# Patient Record
Sex: Female | Born: 1960 | Race: Black or African American | Hispanic: No | Smoking: Never smoker
Health system: Southern US, Community
[De-identification: ages and names within clinical notes are randomized; demographics above are authoritative.]

## PROBLEM LIST (undated history)

## (undated) DIAGNOSIS — R7303 Prediabetes: Secondary | ICD-10-CM

## (undated) DIAGNOSIS — R0600 Dyspnea, unspecified: Secondary | ICD-10-CM

## (undated) DIAGNOSIS — R011 Cardiac murmur, unspecified: Secondary | ICD-10-CM

## (undated) DIAGNOSIS — D573 Sickle-cell trait: Secondary | ICD-10-CM

## (undated) DIAGNOSIS — R600 Localized edema: Secondary | ICD-10-CM

## (undated) DIAGNOSIS — M2550 Pain in unspecified joint: Secondary | ICD-10-CM

## (undated) DIAGNOSIS — K219 Gastro-esophageal reflux disease without esophagitis: Secondary | ICD-10-CM

## (undated) DIAGNOSIS — I10 Essential (primary) hypertension: Secondary | ICD-10-CM

## (undated) DIAGNOSIS — M1990 Unspecified osteoarthritis, unspecified site: Secondary | ICD-10-CM

## (undated) DIAGNOSIS — D649 Anemia, unspecified: Secondary | ICD-10-CM

## (undated) DIAGNOSIS — T7840XA Allergy, unspecified, initial encounter: Secondary | ICD-10-CM

## (undated) DIAGNOSIS — E785 Hyperlipidemia, unspecified: Secondary | ICD-10-CM

## (undated) HISTORY — DX: Anemia, unspecified: D64.9

## (undated) HISTORY — PX: OTHER SURGICAL HISTORY: SHX169

## (undated) HISTORY — DX: Gastro-esophageal reflux disease without esophagitis: K21.9

## (undated) HISTORY — DX: Localized edema: R60.0

## (undated) HISTORY — PX: GANGLION CYST EXCISION: SHX1691

## (undated) HISTORY — DX: Dyspnea, unspecified: R06.00

## (undated) HISTORY — DX: Prediabetes: R73.03

## (undated) HISTORY — PX: WRIST SURGERY: SHX841

## (undated) HISTORY — DX: Unspecified osteoarthritis, unspecified site: M19.90

## (undated) HISTORY — DX: Essential (primary) hypertension: I10

## (undated) HISTORY — DX: Allergy, unspecified, initial encounter: T78.40XA

## (undated) HISTORY — DX: Hyperlipidemia, unspecified: E78.5

## (undated) HISTORY — DX: Sickle-cell trait: D57.3

## (undated) HISTORY — DX: Pain in unspecified joint: M25.50

---

## 1997-10-14 ENCOUNTER — Ambulatory Visit (HOSPITAL_COMMUNITY): Admission: RE | Admit: 1997-10-14 | Discharge: 1997-10-14 | Payer: Self-pay | Admitting: Obstetrics and Gynecology

## 1998-07-08 ENCOUNTER — Other Ambulatory Visit: Admission: RE | Admit: 1998-07-08 | Discharge: 1998-07-08 | Payer: Self-pay | Admitting: Obstetrics and Gynecology

## 1999-03-02 ENCOUNTER — Emergency Department (HOSPITAL_COMMUNITY): Admission: EM | Admit: 1999-03-02 | Discharge: 1999-03-02 | Payer: Self-pay | Admitting: Internal Medicine

## 1999-07-18 ENCOUNTER — Other Ambulatory Visit: Admission: RE | Admit: 1999-07-18 | Discharge: 1999-07-18 | Payer: Self-pay | Admitting: Obstetrics and Gynecology

## 1999-08-05 ENCOUNTER — Other Ambulatory Visit: Admission: RE | Admit: 1999-08-05 | Discharge: 1999-08-05 | Payer: Self-pay | Admitting: Obstetrics and Gynecology

## 2000-01-15 ENCOUNTER — Emergency Department (HOSPITAL_COMMUNITY): Admission: EM | Admit: 2000-01-15 | Discharge: 2000-01-15 | Payer: Self-pay | Admitting: Emergency Medicine

## 2000-01-27 ENCOUNTER — Encounter: Payer: Self-pay | Admitting: Family Medicine

## 2000-01-27 ENCOUNTER — Encounter: Admission: RE | Admit: 2000-01-27 | Discharge: 2000-01-27 | Payer: Self-pay | Admitting: Family Medicine

## 2000-07-19 ENCOUNTER — Other Ambulatory Visit: Admission: RE | Admit: 2000-07-19 | Discharge: 2000-07-19 | Payer: Self-pay | Admitting: Obstetrics and Gynecology

## 2002-12-03 ENCOUNTER — Other Ambulatory Visit: Admission: RE | Admit: 2002-12-03 | Discharge: 2002-12-03 | Payer: Self-pay | Admitting: Family Medicine

## 2003-10-24 ENCOUNTER — Emergency Department (HOSPITAL_COMMUNITY): Admission: EM | Admit: 2003-10-24 | Discharge: 2003-10-25 | Payer: Self-pay | Admitting: Emergency Medicine

## 2004-01-14 ENCOUNTER — Other Ambulatory Visit: Admission: RE | Admit: 2004-01-14 | Discharge: 2004-01-14 | Payer: Self-pay | Admitting: Family Medicine

## 2004-10-20 ENCOUNTER — Other Ambulatory Visit: Admission: RE | Admit: 2004-10-20 | Discharge: 2004-10-20 | Payer: Self-pay | Admitting: Obstetrics and Gynecology

## 2004-10-20 ENCOUNTER — Ambulatory Visit (HOSPITAL_COMMUNITY): Admission: RE | Admit: 2004-10-20 | Discharge: 2004-10-20 | Payer: Self-pay | Admitting: Obstetrics and Gynecology

## 2005-01-17 ENCOUNTER — Other Ambulatory Visit: Admission: RE | Admit: 2005-01-17 | Discharge: 2005-01-17 | Payer: Self-pay | Admitting: Family Medicine

## 2006-01-17 ENCOUNTER — Other Ambulatory Visit: Admission: RE | Admit: 2006-01-17 | Discharge: 2006-01-17 | Payer: Self-pay | Admitting: Family Medicine

## 2006-09-17 ENCOUNTER — Other Ambulatory Visit: Admission: RE | Admit: 2006-09-17 | Discharge: 2006-09-17 | Payer: Self-pay | Admitting: Obstetrics and Gynecology

## 2007-02-11 ENCOUNTER — Other Ambulatory Visit: Admission: RE | Admit: 2007-02-11 | Discharge: 2007-02-11 | Payer: Self-pay | Admitting: Family Medicine

## 2007-12-09 ENCOUNTER — Other Ambulatory Visit: Admission: RE | Admit: 2007-12-09 | Discharge: 2007-12-09 | Payer: Self-pay | Admitting: *Deleted

## 2007-12-20 ENCOUNTER — Emergency Department (HOSPITAL_COMMUNITY): Admission: EM | Admit: 2007-12-20 | Discharge: 2007-12-20 | Payer: Self-pay | Admitting: Emergency Medicine

## 2008-01-02 ENCOUNTER — Ambulatory Visit (HOSPITAL_BASED_OUTPATIENT_CLINIC_OR_DEPARTMENT_OTHER): Admission: RE | Admit: 2008-01-02 | Discharge: 2008-01-02 | Payer: Self-pay | Admitting: Surgery

## 2008-01-02 ENCOUNTER — Encounter (INDEPENDENT_AMBULATORY_CARE_PROVIDER_SITE_OTHER): Payer: Self-pay | Admitting: Surgery

## 2008-03-05 ENCOUNTER — Encounter: Admission: RE | Admit: 2008-03-05 | Discharge: 2008-03-05 | Payer: Self-pay | Admitting: Family Medicine

## 2008-08-07 ENCOUNTER — Other Ambulatory Visit: Admission: RE | Admit: 2008-08-07 | Discharge: 2008-08-07 | Payer: Self-pay | Admitting: Radiology

## 2009-10-21 ENCOUNTER — Emergency Department (HOSPITAL_BASED_OUTPATIENT_CLINIC_OR_DEPARTMENT_OTHER): Admission: EM | Admit: 2009-10-21 | Discharge: 2009-10-21 | Payer: Self-pay | Admitting: Emergency Medicine

## 2010-02-16 ENCOUNTER — Ambulatory Visit: Payer: Self-pay | Admitting: Cardiovascular Disease

## 2010-02-16 ENCOUNTER — Ambulatory Visit (HOSPITAL_COMMUNITY): Admission: RE | Admit: 2010-02-16 | Discharge: 2010-02-16 | Payer: Self-pay | Admitting: Family Medicine

## 2010-02-16 ENCOUNTER — Encounter (INDEPENDENT_AMBULATORY_CARE_PROVIDER_SITE_OTHER): Payer: Self-pay | Admitting: Family Medicine

## 2010-03-21 ENCOUNTER — Ambulatory Visit (HOSPITAL_BASED_OUTPATIENT_CLINIC_OR_DEPARTMENT_OTHER): Admission: RE | Admit: 2010-03-21 | Discharge: 2010-03-21 | Payer: Self-pay | Admitting: Orthopedic Surgery

## 2010-06-14 ENCOUNTER — Inpatient Hospital Stay (INDEPENDENT_AMBULATORY_CARE_PROVIDER_SITE_OTHER)
Admission: RE | Admit: 2010-06-14 | Discharge: 2010-06-14 | Disposition: A | Discharge status: Home / Self Care | Payer: 59 | Source: Ambulatory Visit | Attending: Emergency Medicine | Admitting: Emergency Medicine

## 2010-06-14 DIAGNOSIS — H109 Unspecified conjunctivitis: Secondary | ICD-10-CM

## 2010-06-14 DIAGNOSIS — H5710 Ocular pain, unspecified eye: Secondary | ICD-10-CM

## 2010-07-12 LAB — BASIC METABOLIC PANEL
BUN: 11 mg/dL (ref 6–23)
CO2: 28 mEq/L (ref 19–32)
Calcium: 9.4 mg/dL (ref 8.4–10.5)
Chloride: 97 mEq/L (ref 96–112)
Creatinine, Ser: 0.87 mg/dL (ref 0.4–1.2)
GFR calc Af Amer: >60 mL/min (ref >60)
GFR calc non Af Amer: >60 mL/min (ref >60)
Glucose, Bld: 152 mg/dL — ABNORMAL HIGH (ref 70–99)
Potassium: 2.8 mEq/L — ABNORMAL LOW (ref 3.5–5.1)
Sodium: 134 mEq/L — ABNORMAL LOW (ref 135–145)

## 2010-07-12 LAB — POCT I-STAT, CHEM 8
BUN: 13 mg/dL (ref 6–23)
Calcium, Ion: 1.2 mmol/L (ref 1.12–1.32)
Chloride: 101 mEq/L (ref 96–112)
Creatinine, Ser: 0.9 mg/dL (ref 0.4–1.2)
Glucose, Bld: 108 mg/dL — ABNORMAL HIGH (ref 70–99)
HCT: 40 % (ref 36.0–46.0)
Hemoglobin: 13.6 g/dL (ref 12.0–15.0)
Potassium: 3.4 mEq/L — ABNORMAL LOW (ref 3.5–5.1)
Sodium: 139 mEq/L (ref 135–145)
TCO2: 29 mmol/L (ref 0–100)

## 2010-07-17 LAB — URINALYSIS, ROUTINE W REFLEX MICROSCOPIC
Bilirubin Urine: NEGATIVE
Hgb urine dipstick: NEGATIVE
Ketones, ur: NEGATIVE mg/dL
Nitrite: NEGATIVE
Protein, ur: NEGATIVE mg/dL
Specific Gravity, Urine: 1.018 (ref 1.005–1.030)
Urobilinogen, UA: 1 mg/dL (ref 0.0–1.0)

## 2010-08-29 ENCOUNTER — Other Ambulatory Visit: Payer: Self-pay | Admitting: Obstetrics and Gynecology

## 2010-09-13 NOTE — Op Note (Signed)
NAME:  [name], Leslie              ACCOUNT NO.:  [id number]   MEDICAL RECORD NO.:  [id number]          PATIENT TYPE:  AMB   LOCATION:  DSC                          FACILITY:  [facility]   PHYSICIAN:  Wilmon Arms. Corliss Skains, M.D. DATE OF BIRTH:  Aug 02, 1960   DATE OF PROCEDURE:  01/02/2008  DATE OF DISCHARGE:                               OPERATIVE REPORT   PREOPERATIVE DIAGNOSIS:  Lipoma, 3-cm, mid back.   POSTOPERATIVE DIAGNOSIS:  Lipoma, 3-cm, mid back.   PROCEDURE PERFORMED:  Excision of lipoma of the back.   SURGEON:  Wilmon Arms. Tsuei, MD   ANESTHESIA:  Local MAC.   INDICATIONS:  The patient is a 50 year old female who presents with a  palpable mass in her mid back.  This was just left to midline.  This has  been present for several years and it has become slightly larger.  This  causes discomfort when she tries to lay down or sit back in a chair.  She presents now for elective excision.   DESCRIPTION OF PROCEDURE:  The patient was brought to the operating room  and placed in a lateral position on her left side.  Her back was prepped  with Betadine and draped in sterile fashion.  She was given some  intravenous sedation.  We infiltrated the area around the mass with  0.25% Marcaine with epinephrine.  A transverse incision was made over  the mass.  Dissection was carried down to the dermis and the  subcutaneous tissues.  We dissected down to the lipoma.  This was fairly  deep and was resting on the muscle.  We excised the entire lipoma along  with some of the anterior subcutaneous tissue.  We sent this specimen  for Pathology.  The wound was then inspected for hemostasis.  It was  closed with a deep layer of 3-0 Vicryl and a subcuticular 4-0 Monocryl.  Steri-Strips and clean dressings were applied.  The patient was then  awakened and brought to recovery in stable condition.  All sponge,  instrument, and needle counts were correct.      Wilmon Arms. Tsuei, M.D.  Electronically  Signed     MKT/MEDQ  D:  01/02/2008  T:  01/02/2008  Job:  [id number]

## 2012-11-21 ENCOUNTER — Other Ambulatory Visit: Payer: Self-pay

## 2013-01-20 ENCOUNTER — Encounter: Payer: Self-pay | Admitting: Gastroenterology

## 2013-03-11 ENCOUNTER — Ambulatory Visit (AMBULATORY_SURGERY_CENTER): Payer: Self-pay

## 2013-03-11 VITALS — Ht 61.5 in | Wt 218.2 lb

## 2013-03-11 DIAGNOSIS — Z1211 Encounter for screening for malignant neoplasm of colon: Secondary | ICD-10-CM

## 2013-03-11 MED ORDER — NA SULFATE-K SULFATE-MG SULF 17.5-3.13-1.6 GM/177ML PO SOLN: ORAL | Status: DC

## 2013-03-25 ENCOUNTER — Ambulatory Visit (AMBULATORY_SURGERY_CENTER): Payer: 59 | Admitting: Gastroenterology

## 2013-03-25 ENCOUNTER — Encounter: Payer: Self-pay | Admitting: Gastroenterology

## 2013-03-25 VITALS — BP 113/70 | HR 56 | Temp 96.1°F | Resp 27 | Ht 61.5 in | Wt 218.0 lb

## 2013-03-25 DIAGNOSIS — Z1211 Encounter for screening for malignant neoplasm of colon: Secondary | ICD-10-CM

## 2013-03-25 MED ORDER — SODIUM CHLORIDE 0.9 % IV SOLN: 500.0000 mL | INTRAVENOUS | Status: DC

## 2013-03-25 NOTE — Op Note (Signed)
[facility] [address] [place], [address]   COLONOSCOPY PROCEDURE REPORT  PATIENT: Leslie [name], Leslie [name]  MR#: [id number] BIRTHDATE: March 24, 1961 , 52  yrs. old GENDER: Female ENDOSCOPIST: Louis Meckel, MD REFERRED BY: PROCEDURE DATE:  03/25/2013 PROCEDURE:   Colonoscopy, diagnostic First Screening Colonoscopy - Avg.  risk and is 50 yrs.  old or older Yes.  Prior Negative Screening - Now for repeat screening. N/A  History of Adenoma - Now for follow-up colonoscopy & has been > or = to 3 yrs.  N/A  Polyps Removed Today? No.  Recommend repeat exam, <10 yrs? No. ASA CLASS: INDICATIONS:average risk screening. MEDICATIONS: MAC sedation, administered by CRNA and propofol (Diprivan) 300mg  IV  DESCRIPTION OF PROCEDURE:   After the risks benefits and alternatives of the procedure were thoroughly explained, informed consent was obtained.  A digital rectal exam revealed no abnormalities of the rectum.   The LB [id number]  endoscope was introduced through the anus and advanced to the cecum, which was identified by both the appendix and ileocecal valve. No adverse events experienced.   The quality of the prep was excellent using Suprep  The instrument was then slowly withdrawn as the colon was fully examined.      COLON FINDINGS: A normal appearing cecum, ileocecal valve, and appendiceal orifice were identified.  The ascending, hepatic flexure, transverse, splenic flexure, descending, sigmoid colon and rectum appeared unremarkable.  No polyps or cancers were seen. Retroflexed views revealed no abnormalities. The time to cecum=3 minutes 24 seconds.  Withdrawal time=6 minutes 10 seconds.  The scope was withdrawn and the procedure completed. COMPLICATIONS: There were no complications.  ENDOSCOPIC IMPRESSION: Normal colon  RECOMMENDATIONS: Continue current colorectal screening recommendations for "routine risk" patients with a repeat colonoscopy in 10  years.   eSigned:  Louis Meckel, MD 03/25/2013 8:34 AM   cc:

## 2013-03-25 NOTE — Progress Notes (Signed)
No complaints noted in the recovery room. Maw   

## 2013-03-25 NOTE — Patient Instructions (Signed)

## 2013-03-25 NOTE — Progress Notes (Signed)
Lidocaine-40mg IV prior to Propofol InductionPropofol given over incremental dosages 

## 2013-03-26 ENCOUNTER — Telehealth: Payer: Self-pay | Admitting: *Deleted

## 2013-03-26 NOTE — Telephone Encounter (Signed)
Number identifier, left message, follow-up  

## 2014-01-07 ENCOUNTER — Encounter (HOSPITAL_COMMUNITY): Payer: Self-pay | Admitting: Emergency Medicine

## 2014-01-07 ENCOUNTER — Emergency Department (HOSPITAL_COMMUNITY)
Admission: EM | Admit: 2014-01-07 | Discharge: 2014-01-07 | Disposition: A | Discharge status: Home / Self Care | Payer: 59 | Source: Home / Self Care | Attending: Family Medicine | Admitting: Family Medicine

## 2014-01-07 DIAGNOSIS — G629 Polyneuropathy, unspecified: Secondary | ICD-10-CM

## 2014-01-07 DIAGNOSIS — G589 Mononeuropathy, unspecified: Secondary | ICD-10-CM

## 2014-01-07 NOTE — ED Provider Notes (Signed)
CSN: [id number]     Arrival date & time 01/07/14  1607 History   First MD Initiated Contact with Patient 01/07/14 1640     Chief Complaint  Patient presents with  . Numbness  . Joint Swelling   (Consider location/radiation/quality/duration/timing/severity/associated sxs/prior Treatment) HPI  Big toe numbness. L>R. Started on Friday. Significant increase in ambulation on Friday while wearing heals on Friday. Swelled a lot that day as well. Swelling improved on Saturday. Heals again on Saturday and SUnday. Swelling improves w/ rest. No chang ein numbness since Friday evening. Deneis trauma or h/o numbness. Last year told by her PCP that she was borderline DM. Denies any numbness in any other extremities, HA, SZRs, falls.    Past Medical History  Diagnosis Date  . Hypertension   . Allergy   . GERD (gastroesophageal reflux disease)    Past Surgical History  Procedure Laterality Date  . Wrist surgery    . Ganglion cyst excision      left hand  . Cyst on neck    . Cyst on back      benign   Family History  Problem Relation Age of Onset  . Breast cancer Mother    History  Substance Use Topics  . Smoking status: Never Smoker   . Smokeless tobacco: Never Used  . Alcohol Use: No     Comment: occ   OB History   Grav Para Term Preterm Abortions TAB SAB Ect Mult Living                 Review of Systems Per HPI with all other pertinent systems negative.   Allergies  Review of patient's allergies indicates no known allergies.  Home Medications   Prior to Admission medications   Medication Sig Start Date End Date Taking? Authorizing Provider  atenolol-chlorthalidone (TENORETIC) 50-25 MG per tablet Take 1 tablet by mouth daily.   Yes Historical Provider, MD  cetirizine (ZYRTEC) 10 MG tablet Take 10 mg by mouth as needed for allergies.    Historical Provider, MD  ibuprofen (ADVIL,MOTRIN) 200 MG tablet Take 200 mg by mouth every 6 (six) hours as needed.    Historical Provider, MD   Multiple Vitamin (MULTIVITAMIN) tablet Take 1 tablet by mouth daily. Gummi MVI-Take one daily    Historical Provider, MD  omeprazole (PRILOSEC) 40 MG capsule Take 40 mg by mouth as needed.    Historical Provider, MD   BP 134/82  Pulse 63  Temp(Src) 98.3 F (36.8 C) (Oral)  Resp 18  SpO2 100% Physical Exam  Constitutional: She is oriented to person, place, and time. She appears well-developed and well-nourished. No distress.  HENT:  Head: Normocephalic and atraumatic.  Eyes: EOM are normal. Pupils are equal, round, and reactive to light.  Neck: Normal range of motion. Neck supple.  Cardiovascular: Normal rate, normal heart sounds and intact distal pulses.   Pulmonary/Chest: Effort normal and breath sounds normal.  Abdominal: Soft. Bowel sounds are normal.  Musculoskeletal: Normal range of motion. She exhibits no edema and no tenderness.  Neurological: She is alert and oriented to person, place, and time. No cranial nerve deficit. She exhibits normal muscle tone. Coordination normal.  Big toes w/ decreased but intact sensation along the bottom and tips of the big toes bilat  Skin: Skin is warm. No rash noted. She is not diaphoretic.  Psychiatric: She has a normal mood and affect. Her behavior is normal. Judgment and thought content normal.      ED  Course  Procedures (including critical care time) Labs Review Labs Reviewed - No data to display  Imaging Review No results found.   MDM   1. Neuropathy   mild traumatic neuropathy.  Likely to self resolve  NSAIDs  Change footwear DM check at next PCP appt Rest F/u PCP if not resolving  Precautions given and all questions answered  Linna Darner, MD Family Medicine 01/07/2014, 5:06 PM     Waldemar Dickens, MD 01/07/14 [phone]

## 2014-01-07 NOTE — Discharge Instructions (Signed)
Your toe pain is likely what is called neuropathic pain from trauma to the nerves along the bottoms of the big toes  Please start ibuprofen 600mg  every 6 hours Please change your footwear adn rest as needed This will likely resolve over the next 2-8 weeks Consider getting rechecked for diabetes if this is not improving.   Neuropathic Pain We often think that pain has a physical cause. If we get rid of the cause, the pain should go away. Nerves themselves can also cause pain. It is called neuropathic pain, which means nerve abnormality. It may be difficult for the patients who have it and for the treating caregivers. Pain is usually described as acute (short-lived) or chronic (long-lasting). Acute pain is related to the physical sensations caused by an injury. It can last from a few seconds to many weeks, but it usually goes away when normal healing occurs. Chronic pain lasts beyond the typical healing time. With neuropathic pain, the nerve fibers themselves may be damaged or injured. They then send incorrect signals to other pain centers. The pain you feel is real, but the cause is not easy to find.  CAUSES  Chronic pain can result from diseases, such as diabetes and shingles (an infection related to chickenpox), or from trauma, surgery, or amputation. It can also happen without any known injury or disease. The nerves are sending pain messages, even though there is no identifiable cause for such messages.   Other common causes of neuropathy include diabetes, phantom limb pain, or Regional Pain Syndrome (RPS).  As with all forms of chronic back pain, if neuropathy is not correctly treated, there can be a number of associated problems that lead to a downward cycle for the patient. These include depression, sleeplessness, feelings of fear and anxiety, limited social interaction and inability to do normal daily activities or work.  The most dramatic and mysterious example of neuropathic pain is called  "phantom limb syndrome." This occurs when an arm or a leg has been removed because of illness or injury. The brain still gets pain messages from the nerves that originally carried impulses from the missing limb. These nerves now seem to misfire and cause troubling pain.  Neuropathic pain often seems to have no cause. It responds poorly to standard pain treatment. Neuropathic pain can occur after:  Shingles (herpes zoster virus infection).  A lasting burning sensation of the skin, caused usually by injury to a peripheral nerve.  Peripheral neuropathy which is widespread nerve damage, often caused by diabetes or alcoholism.  Phantom limb pain following an amputation.  Facial nerve problems (trigeminal neuralgia).  Multiple sclerosis.  Reflex sympathetic dystrophy.  Pain which comes with cancer and cancer chemotherapy.  Entrapment neuropathy such as when pressure is put on a nerve such as in carpal tunnel syndrome.  Back, leg, and hip problems (sciatica).  Spine or back surgery.  HIV Infection or AIDS where nerves are infected by viruses. Your caregiver can explain items in the above list which may apply to you. SYMPTOMS  Characteristics of neuropathic pain are:  Severe, sharp, electric shock-like, shooting, lightening-like, knife-like.  Pins and needles sensation.  Deep burning, deep cold, or deep ache.  Persistent numbness, tingling, or weakness.  Pain resulting from light touch or other stimulus that would not usually cause pain.  Increased sensitivity to something that would normally cause pain, such as a pinprick. Pain may persist for months or years following the healing of damaged tissues. When this happens, pain signals no longer  sound an alarm about current injuries or injuries about to happen. Instead, the alarm system itself is not working correctly.  Neuropathic pain may get worse instead of better over time. For some people, it can lead to serious disability. It  is important to be aware that severe injury in a limb can occur without a proper, protective pain response.Burns, cuts, and other injuries may go unnoticed. Without proper treatment, these injuries can become infected or lead to further disability. Take any injury seriously, and consult your caregiver for treatment. DIAGNOSIS  When you have a pain with no known cause, your caregiver will probably ask some specific questions:   Do you have any other conditions, such as diabetes, shingles, multiple sclerosis, or HIV infection?  How would you describe your pain? (Neuropathic pain is often described as shooting, stabbing, burning, or searing.)  Is your pain worse at any time of the day? (Neuropathic pain is usually worse at night.)  Does the pain seem to follow a certain physical pathway?  Does the pain come from an area that has missing or injured nerves? (An example would be phantom limb pain.)  Is the pain triggered by minor things such as rubbing against the sheets at night? These questions often help define the type of pain involved. Once your caregiver knows what is happening, treatment can begin. Anticonvulsant, antidepressant drugs, and various pain relievers seem to work in some cases. If another condition, such as diabetes is involved, better management of that disorder may relieve the neuropathic pain.  TREATMENT  Neuropathic pain is frequently long-lasting and tends not to respond to treatment with narcotic type pain medication. It may respond well to other drugs such as antiseizure and antidepressant medications. Usually, neuropathic problems do not completely go away, but partial improvement is often possible with proper treatment. Your caregivers have large numbers of medications available to treat you. Do not be discouraged if you do not get immediate relief. Sometimes different medications or a combination of medications will be tried before you receive the results you are hoping for.  See your caregiver if you have pain that seems to be coming from nowhere and does not go away. Help is available.  SEEK IMMEDIATE MEDICAL CARE IF:   There is a sudden change in the quality of your pain, especially if the change is on only one side of the body.  You notice changes of the skin, such as redness, black or purple discoloration, swelling, or an ulcer.  You cannot move the affected limbs. Document Released: 01/13/2004 Document Revised: 07/10/2011 Document Reviewed: 01/13/2004 [facility] Patient Information 2015 [place], [place]. This information is not intended to replace advice given to you by your health care provider. Make sure you discuss any questions you have with your health care provider.

## 2014-01-07 NOTE — ED Notes (Signed)
C/o numbness of bilateral toes onset Friday Reports it started out w/swelling Ambulated w/steady gait; NAD Alert, no signs of acute distress.

## 2014-09-15 ENCOUNTER — Other Ambulatory Visit (HOSPITAL_COMMUNITY): Payer: Self-pay | Admitting: Orthopedic Surgery

## 2014-09-15 DIAGNOSIS — M25561 Pain in right knee: Secondary | ICD-10-CM

## 2014-09-22 ENCOUNTER — Ambulatory Visit (HOSPITAL_COMMUNITY): Payer: 59

## 2014-10-01 ENCOUNTER — Ambulatory Visit (HOSPITAL_COMMUNITY): Admission: RE | Admit: 2014-10-01 | Payer: 59 | Source: Ambulatory Visit

## 2014-10-12 ENCOUNTER — Ambulatory Visit (HOSPITAL_COMMUNITY)
Admission: RE | Admit: 2014-10-12 | Discharge: 2014-10-12 | Disposition: A | Discharge status: Home / Self Care | Payer: 59 | Source: Ambulatory Visit | Attending: Orthopedic Surgery | Admitting: Orthopedic Surgery

## 2014-10-12 DIAGNOSIS — M25561 Pain in right knee: Secondary | ICD-10-CM

## 2014-10-12 DIAGNOSIS — M25461 Effusion, right knee: Secondary | ICD-10-CM | POA: Insufficient documentation

## 2014-10-12 DIAGNOSIS — M94261 Chondromalacia, right knee: Secondary | ICD-10-CM | POA: Insufficient documentation

## 2015-04-16 ENCOUNTER — Emergency Department (INDEPENDENT_AMBULATORY_CARE_PROVIDER_SITE_OTHER): Payer: 59

## 2015-04-16 ENCOUNTER — Emergency Department (HOSPITAL_COMMUNITY)
Admission: EM | Admit: 2015-04-16 | Discharge: 2015-04-16 | Disposition: A | Discharge status: Home / Self Care | Payer: 59 | Source: Home / Self Care | Attending: Family Medicine | Admitting: Family Medicine

## 2015-04-16 ENCOUNTER — Encounter (HOSPITAL_COMMUNITY): Payer: Self-pay | Admitting: *Deleted

## 2015-04-16 DIAGNOSIS — J111 Influenza due to unidentified influenza virus with other respiratory manifestations: Secondary | ICD-10-CM

## 2015-04-16 DIAGNOSIS — R69 Illness, unspecified: Principal | ICD-10-CM

## 2015-04-16 MED ORDER — IPRATROPIUM BROMIDE 0.06 % NA SOLN: 2.0000 | Freq: Four times a day (QID) | NASAL | Status: DC

## 2015-04-16 NOTE — Discharge Instructions (Signed)
Drink plenty of fluids as discussed, use medicine as prescribed, and mucinex or delsym for cough. Return or see your doctor if further problems °

## 2015-04-16 NOTE — ED Provider Notes (Signed)
CSN: [id number]     Arrival date & time 04/16/15  1359 History   First MD Initiated Contact with Patient 04/16/15 1404     Chief Complaint  Patient presents with  . URI   (Consider location/radiation/quality/duration/timing/severity/associated sxs/prior Treatment) Patient is a 54 y.o. female presenting with URI. The history is provided by the patient.  URI Presenting symptoms: congestion, cough and rhinorrhea   Presenting symptoms: no fever   Severity:  Mild Onset quality:  Gradual Duration:  1 week Progression:  Unchanged Chronicity:  New Relieved by:  None tried Worsened by:  Nothing tried Ineffective treatments:  None tried Associated symptoms: no wheezing   Risk factors: sick contacts     Past Medical History  Diagnosis Date  . Hypertension   . Allergy   . GERD (gastroesophageal reflux disease)    Past Surgical History  Procedure Laterality Date  . Wrist surgery    . Ganglion cyst excision      left hand  . Cyst on neck    . Cyst on back      benign   Family History  Problem Relation Age of Onset  . Breast cancer Mother    Social History  Substance Use Topics  . Smoking status: Never Smoker   . Smokeless tobacco: Never Used  . Alcohol Use: No     Comment: occ   OB History    No data available     Review of Systems  Constitutional: Negative for fever and chills.  HENT: Positive for congestion, postnasal drip and rhinorrhea.   Respiratory: Positive for cough. Negative for shortness of breath and wheezing.   Cardiovascular: Negative.   Genitourinary: Negative.   All other systems reviewed and are negative.   Allergies  Review of patient's allergies indicates no known allergies.  Home Medications   Prior to Admission medications   Medication Sig Start Date End Date Taking? Authorizing Provider  atenolol-chlorthalidone (TENORETIC) 50-25 MG per tablet Take 1 tablet by mouth daily.    Historical Provider, MD  cetirizine (ZYRTEC) 10 MG tablet Take  10 mg by mouth as needed for allergies.    Historical Provider, MD  ibuprofen (ADVIL,MOTRIN) 200 MG tablet Take 200 mg by mouth every 6 (six) hours as needed.    Historical Provider, MD  ipratropium (ATROVENT) 0.06 % nasal spray Place 2 sprays into both nostrils 4 (four) times daily. 04/16/15   Billy Fischer, MD  Multiple Vitamin (MULTIVITAMIN) tablet Take 1 tablet by mouth daily. Gummi MVI-Take one daily    Historical Provider, MD  omeprazole (PRILOSEC) 40 MG capsule Take 40 mg by mouth as needed.    Historical Provider, MD   Meds Ordered and Administered this Visit  Medications - No data to display  BP 155/83 mmHg  Pulse 74  Temp(Src) 98.3 F (36.8 C) (Oral)  Resp 16  SpO2 98% No data found.   Physical Exam  Constitutional: She is oriented to person, place, and time. She appears well-developed and well-nourished. No distress.  HENT:  Head: Normocephalic.  Right Ear: External ear normal.  Left Ear: External ear normal.  Mouth/Throat: Oropharynx is clear and moist.  Eyes: Conjunctivae are normal. Pupils are equal, round, and reactive to light.  Neck: Normal range of motion. Neck supple.  Cardiovascular: Regular rhythm and normal heart sounds.   Pulmonary/Chest: Effort normal and breath sounds normal.  Lymphadenopathy:    She has no cervical adenopathy.  Neurological: She is alert and oriented to person, place, and  time.  Skin: Skin is warm and dry.  Nursing note and vitals reviewed.   ED Course  Procedures (including critical care time)  Labs Review Labs Reviewed - No data to display  Imaging Review Dg Chest 2 View  04/16/2015  CLINICAL DATA:  Cold like symptoms for a week, cough, sneezing EXAM: CHEST  2 VIEW COMPARISON:  03/05/2008 FINDINGS: There is no focal parenchymal opacity. There is no pleural effusion or pneumothorax. The heart and mediastinal contours are unremarkable. The osseous structures are unremarkable. IMPRESSION: No active cardiopulmonary disease.  Electronically Signed   By: Kathreen Devoid   On: 04/16/2015 14:32   X-rays reviewed and report per radiologist.   Visual Acuity Review  Right Eye Distance:   Left Eye Distance:   Bilateral Distance:    Right Eye Near:   Left Eye Near:    Bilateral Near:         MDM   1. Influenza-like illness        Billy Fischer, MD 04/16/15 [phone]

## 2015-04-16 NOTE — ED Notes (Signed)
Pt reports  Symptoms  Of  sorethroat  As   Well       As   Congestion     X     8 days

## 2015-05-20 MED FILL — IBUPROFEN 800 MG TABLET: 800 | 30 days supply | Qty: 90 | Fill #0

## 2015-05-20 MED FILL — ATENOLOL/CHLORTHAL 50/25: 50-25 | 90 days supply | Qty: 90 | Fill #1

## 2015-08-19 MED FILL — ATENOLOL/CHLORTHAL 50/25: 50-25 | 90 days supply | Qty: 90 | Fill #2

## 2015-11-18 MED FILL — ATENOLOL/CHLORTHAL 50/25: 50-25 | 90 days supply | Qty: 90 | Fill #3

## 2015-11-23 DIAGNOSIS — N63 Unspecified lump in breast: Secondary | ICD-10-CM | POA: Diagnosis not present

## 2015-11-23 DIAGNOSIS — N6002 Solitary cyst of left breast: Secondary | ICD-10-CM | POA: Diagnosis not present

## 2015-11-23 DIAGNOSIS — N6001 Solitary cyst of right breast: Secondary | ICD-10-CM | POA: Diagnosis not present

## 2015-12-08 DIAGNOSIS — Z6841 Body Mass Index (BMI) 40.0 and over, adult: Secondary | ICD-10-CM | POA: Diagnosis not present

## 2015-12-08 DIAGNOSIS — R109 Unspecified abdominal pain: Secondary | ICD-10-CM | POA: Diagnosis not present

## 2015-12-08 DIAGNOSIS — Z113 Encounter for screening for infections with a predominantly sexual mode of transmission: Secondary | ICD-10-CM | POA: Diagnosis not present

## 2015-12-08 DIAGNOSIS — Z1151 Encounter for screening for human papillomavirus (HPV): Secondary | ICD-10-CM | POA: Diagnosis not present

## 2015-12-08 DIAGNOSIS — R102 Pelvic and perineal pain: Secondary | ICD-10-CM | POA: Diagnosis not present

## 2015-12-08 DIAGNOSIS — Z01419 Encounter for gynecological examination (general) (routine) without abnormal findings: Secondary | ICD-10-CM | POA: Diagnosis not present

## 2016-01-13 DIAGNOSIS — J309 Allergic rhinitis, unspecified: Secondary | ICD-10-CM | POA: Diagnosis not present

## 2016-01-13 DIAGNOSIS — R7303 Prediabetes: Secondary | ICD-10-CM | POA: Diagnosis not present

## 2016-01-13 DIAGNOSIS — I10 Essential (primary) hypertension: Secondary | ICD-10-CM | POA: Diagnosis not present

## 2016-01-13 DIAGNOSIS — M170 Bilateral primary osteoarthritis of knee: Secondary | ICD-10-CM | POA: Diagnosis not present

## 2016-03-02 MED FILL — ATENOLOL/CHLORTHAL 50/25: 50-25 | 90 days supply | Qty: 90 | Fill #0

## 2016-03-31 ENCOUNTER — Encounter (HOSPITAL_COMMUNITY): Payer: Self-pay | Admitting: *Deleted

## 2016-03-31 ENCOUNTER — Ambulatory Visit (INDEPENDENT_AMBULATORY_CARE_PROVIDER_SITE_OTHER): Payer: 59

## 2016-03-31 ENCOUNTER — Ambulatory Visit (HOSPITAL_COMMUNITY)
Admission: EM | Admit: 2016-03-31 | Discharge: 2016-03-31 | Disposition: A | Discharge status: Home / Self Care | Payer: 59 | Attending: Family Medicine | Admitting: Family Medicine

## 2016-03-31 DIAGNOSIS — S39012A Strain of muscle, fascia and tendon of lower back, initial encounter: Secondary | ICD-10-CM

## 2016-03-31 DIAGNOSIS — M545 Low back pain: Secondary | ICD-10-CM | POA: Diagnosis not present

## 2016-03-31 MED ORDER — DICLOFENAC POTASSIUM 50 MG PO TABS: 50.0000 mg | ORAL_TABLET | Freq: Three times a day (TID) | ORAL | 0 refills | Status: DC

## 2016-03-31 MED ORDER — CYCLOBENZAPRINE HCL 5 MG PO TABS: 5.0000 mg | ORAL_TABLET | Freq: Three times a day (TID) | ORAL | 0 refills | Status: DC

## 2016-03-31 MED ORDER — KETOROLAC TROMETHAMINE 30 MG/ML IJ SOLN
INTRAMUSCULAR | Status: AC
  Filled 2016-03-31: qty 1

## 2016-03-31 MED ORDER — KETOROLAC TROMETHAMINE 30 MG/ML IJ SOLN
30.0000 mg | Freq: Once | INTRAMUSCULAR | Status: AC
  Administered 2016-03-31: 30 mg via INTRAMUSCULAR

## 2016-03-31 MED FILL — DICLOFENAC POT 50 MG TABLET: 50 | 10 days supply | Qty: 30 | Fill #0

## 2016-03-31 MED FILL — CYCLOBENZAPRINE 5 MG TABLET: 5 | 10 days supply | Qty: 30 | Fill #0

## 2016-03-31 NOTE — ED Provider Notes (Signed)
[facility]    CSN: [id number] Arrival date & time: 03/31/16  1018     History   Chief Complaint Chief Complaint  Patient presents with  . Back Pain    HPI Leslie [name] is a 55 y.o. female.   The history is provided by the patient.  Back Pain  Location:  Lumbar spine Quality:  Stiffness Pain severity:  Moderate Onset quality:  Sudden Progression:  Worsening Chronicity:  New Context comment:  Onset getting into car after prolonged 10hr sitting at work. Relieved by:  None tried Worsened by:  Bending Risk factors: obesity     Past Medical History:  Diagnosis Date  . Allergy   . GERD (gastroesophageal reflux disease)   . Hypertension     There are no active problems to display for this patient.   Past Surgical History:  Procedure Laterality Date  . cyst on back     benign  . cyst on neck    . GANGLION CYST EXCISION     left hand  . WRIST SURGERY      OB History    No data available       Home Medications    Prior to Admission medications   Medication Sig Start Date End Date Taking? Authorizing Provider  atenolol-chlorthalidone (TENORETIC) 50-25 MG per tablet Take 1 tablet by mouth daily.    Historical Provider, MD  cetirizine (ZYRTEC) 10 MG tablet Take 10 mg by mouth as needed for allergies.    Historical Provider, MD  ibuprofen (ADVIL,MOTRIN) 200 MG tablet Take 200 mg by mouth every 6 (six) hours as needed.    Historical Provider, MD  ipratropium (ATROVENT) 0.06 % nasal spray Place 2 sprays into both nostrils 4 (four) times daily. 04/16/15   Billy Fischer, MD  Multiple Vitamin (MULTIVITAMIN) tablet Take 1 tablet by mouth daily. Gummi MVI-Take one daily    Historical Provider, MD  omeprazole (PRILOSEC) 40 MG capsule Take 40 mg by mouth as needed.    Historical Provider, MD    Family History Family History  Problem Relation Age of Onset  . Breast cancer Mother     Social History Social History  Substance Use Topics  . Smoking  status: Never Smoker  . Smokeless tobacco: Never Used  . Alcohol use No     Comment: occ     Allergies   Patient has no known allergies.   Review of Systems Review of Systems  Constitutional: Negative.   Gastrointestinal: Negative.   Genitourinary: Negative.   Musculoskeletal: Positive for back pain. Negative for gait problem and joint swelling.  Neurological: Negative.   All other systems reviewed and are negative.    Physical Exam Triage Vital Signs ED Triage Vitals  Enc Vitals Group     BP 03/31/16 1128 146/86     Pulse Rate 03/31/16 1128 60     Resp 03/31/16 1128 18     Temp 03/31/16 1128 98.6 F (37 C)     Temp Source 03/31/16 1128 Oral     SpO2 03/31/16 1128 100 %     Weight --      Height --      Head Circumference --      Peak Flow --      Pain Score 03/31/16 1127 7     Pain Loc --      Pain Edu? --      Excl. in [place]? --    No data found.  Updated Vital Signs BP 146/86 (BP Location: Right Arm)   Pulse 60   Temp 98.6 F (37 C) (Oral)   Resp 18   SpO2 100%   Visual Acuity Right Eye Distance:   Left Eye Distance:   Bilateral Distance:    Right Eye Near:   Left Eye Near:    Bilateral Near:     Physical Exam  Constitutional: She is oriented to person, place, and time. She appears well-developed and well-nourished. She appears distressed.  Abdominal: Soft. Bowel sounds are normal. There is no tenderness.  Musculoskeletal: She exhibits tenderness.       Right shoulder: She exhibits decreased range of motion, tenderness and decreased strength. She exhibits normal pulse.       Arms: Neurological: She is alert and oriented to person, place, and time.  Skin: Skin is warm and dry.  Nursing note and vitals reviewed.    UC Treatments / Results  Labs (all labs ordered are listed, but only abnormal results are displayed) Labs Reviewed - No data to display  EKG  EKG Interpretation None       Radiology No results found. X-rays reviewed and  report per radiologist.  Procedures Procedures (including critical care time)  Medications Ordered in UC Medications - No data to display   Initial Impression / Assessment and Plan / UC Course  I have reviewed the triage vital signs and the nursing notes.  Pertinent labs & imaging results that were available during my care of the patient were reviewed by me and considered in my medical decision making (see chart for details).  Clinical Course       Final Clinical Impressions(s) / UC Diagnoses   Final diagnoses:  None    New Prescriptions New Prescriptions   No medications on file     Billy Fischer, MD 03/31/16 1242

## 2016-03-31 NOTE — ED Triage Notes (Signed)
Pt  Reports     Symptoms  Of      Back     Pain       -      Pt    Denies     Any   Urinary      Symptoms      She     Ambulated  To  Room  With a  Slow  Steady  Gait       Pt  Is  Awake  And  Alert  And  Oriented

## 2016-06-01 MED FILL — ATENOLOL/CHLORTHAL 50/25: 50-25 | 90 days supply | Qty: 90 | Fill #1

## 2016-09-04 MED FILL — ATENOLOL/CHLORTHAL 50/25: 50-25 | 90 days supply | Qty: 90 | Fill #2

## 2016-11-15 MED FILL — ATENOLOL/CHLORTHAL 50/25: 50-25 | 90 days supply | Qty: 90 | Fill #3

## 2016-12-11 DIAGNOSIS — N6002 Solitary cyst of left breast: Secondary | ICD-10-CM | POA: Diagnosis not present

## 2016-12-11 DIAGNOSIS — Z803 Family history of malignant neoplasm of breast: Secondary | ICD-10-CM | POA: Diagnosis not present

## 2016-12-11 DIAGNOSIS — N6322 Unspecified lump in the left breast, upper inner quadrant: Secondary | ICD-10-CM | POA: Diagnosis not present

## 2016-12-11 DIAGNOSIS — Z01419 Encounter for gynecological examination (general) (routine) without abnormal findings: Secondary | ICD-10-CM | POA: Diagnosis not present

## 2016-12-11 DIAGNOSIS — N6001 Solitary cyst of right breast: Secondary | ICD-10-CM | POA: Diagnosis not present

## 2016-12-11 DIAGNOSIS — Z1151 Encounter for screening for human papillomavirus (HPV): Secondary | ICD-10-CM | POA: Diagnosis not present

## 2016-12-11 DIAGNOSIS — N6012 Diffuse cystic mastopathy of left breast: Secondary | ICD-10-CM | POA: Diagnosis not present

## 2016-12-11 DIAGNOSIS — Z6841 Body Mass Index (BMI) 40.0 and over, adult: Secondary | ICD-10-CM | POA: Diagnosis not present

## 2016-12-11 DIAGNOSIS — N6312 Unspecified lump in the right breast, upper inner quadrant: Secondary | ICD-10-CM | POA: Diagnosis not present

## 2017-01-17 DIAGNOSIS — J309 Allergic rhinitis, unspecified: Secondary | ICD-10-CM | POA: Diagnosis not present

## 2017-01-17 DIAGNOSIS — J069 Acute upper respiratory infection, unspecified: Secondary | ICD-10-CM | POA: Diagnosis not present

## 2017-01-17 DIAGNOSIS — R7303 Prediabetes: Secondary | ICD-10-CM | POA: Diagnosis not present

## 2017-01-17 DIAGNOSIS — K219 Gastro-esophageal reflux disease without esophagitis: Secondary | ICD-10-CM | POA: Diagnosis not present

## 2017-01-17 DIAGNOSIS — M170 Bilateral primary osteoarthritis of knee: Secondary | ICD-10-CM | POA: Diagnosis not present

## 2017-01-17 DIAGNOSIS — I10 Essential (primary) hypertension: Secondary | ICD-10-CM | POA: Diagnosis not present

## 2017-03-02 MED FILL — ATENOLOL/CHLORTHAL 50/25: 50-25 | 90 days supply | Qty: 90 | Fill #0

## 2017-03-14 DIAGNOSIS — H538 Other visual disturbances: Secondary | ICD-10-CM | POA: Diagnosis not present

## 2017-03-14 DIAGNOSIS — H524 Presbyopia: Secondary | ICD-10-CM | POA: Diagnosis not present

## 2017-03-14 DIAGNOSIS — H53143 Visual discomfort, bilateral: Secondary | ICD-10-CM | POA: Diagnosis not present

## 2017-04-19 DIAGNOSIS — E7800 Pure hypercholesterolemia, unspecified: Secondary | ICD-10-CM | POA: Diagnosis not present

## 2017-04-19 DIAGNOSIS — E871 Hypo-osmolality and hyponatremia: Secondary | ICD-10-CM | POA: Diagnosis not present

## 2017-04-20 MED FILL — ATORVASTATIN 10 MG TABLET: 10 | 30 days supply | Qty: 30 | Fill #0

## 2017-05-29 MED FILL — ATENOLOL/CHLORTHAL 50/25: 50-25 | 90 days supply | Qty: 90 | Fill #1

## 2017-05-29 MED FILL — ATORVASTATIN 10 MG TABLET: 10 | 90 days supply | Qty: 90 | Fill #1

## 2017-06-11 DIAGNOSIS — E7800 Pure hypercholesterolemia, unspecified: Secondary | ICD-10-CM | POA: Diagnosis not present

## 2017-06-11 DIAGNOSIS — Z79899 Other long term (current) drug therapy: Secondary | ICD-10-CM | POA: Diagnosis not present

## 2017-08-28 DIAGNOSIS — M722 Plantar fascial fibromatosis: Secondary | ICD-10-CM | POA: Diagnosis not present

## 2017-08-28 MED FILL — MELOXICAM 15 MG TABLET: 15 | 30 days supply | Qty: 30 | Fill #0

## 2017-08-29 MED FILL — ATORVASTATIN 10 MG TABLET: 10 | 90 days supply | Qty: 90 | Fill #2

## 2017-08-29 MED FILL — ATENOLOL/CHLORTHAL 50/25: 50-25 | 90 days supply | Qty: 90 | Fill #2

## 2017-11-09 ENCOUNTER — Telehealth: Payer: Self-pay | Admitting: Family

## 2017-11-09 DIAGNOSIS — G43901 Migraine, unspecified, not intractable, with status migrainosus: Secondary | ICD-10-CM

## 2017-11-09 MED ORDER — IBUPROFEN 800 MG PO TABS: 800.0000 mg | ORAL_TABLET | Freq: Three times a day (TID) | ORAL | 0 refills | Status: DC | PRN

## 2017-11-09 MED FILL — IBUPROFEN 800 MG TAB: 800 | 30 days supply | Qty: 90 | Fill #0

## 2017-11-09 NOTE — Telephone Encounter (Signed)
Leslie [name] needing 800 mg ibuprofen prn for increased migraine pain, # 90 with no refills.  RX for above e-scribed and sent to pharmacy on record  [place], [place] - [address] [place] [address] Phone: [phone] Fax: [phone]

## 2017-12-06 MED FILL — ATENOLOL/CHLORTHAL 50/25: 50-25 | 90 days supply | Qty: 90 | Fill #3

## 2017-12-06 MED FILL — ATORVASTATIN 10 MG TABLET: 10 | 90 days supply | Qty: 90 | Fill #0

## 2017-12-11 DIAGNOSIS — N911 Secondary amenorrhea: Secondary | ICD-10-CM | POA: Diagnosis not present

## 2017-12-11 DIAGNOSIS — D509 Iron deficiency anemia, unspecified: Secondary | ICD-10-CM | POA: Diagnosis not present

## 2017-12-11 DIAGNOSIS — Z1231 Encounter for screening mammogram for malignant neoplasm of breast: Secondary | ICD-10-CM | POA: Diagnosis not present

## 2017-12-11 DIAGNOSIS — Z01419 Encounter for gynecological examination (general) (routine) without abnormal findings: Secondary | ICD-10-CM | POA: Diagnosis not present

## 2017-12-11 DIAGNOSIS — Z1151 Encounter for screening for human papillomavirus (HPV): Secondary | ICD-10-CM | POA: Diagnosis not present

## 2018-01-03 DIAGNOSIS — Z3202 Encounter for pregnancy test, result negative: Secondary | ICD-10-CM | POA: Diagnosis not present

## 2018-01-03 DIAGNOSIS — T8332XA Displacement of intrauterine contraceptive device, initial encounter: Secondary | ICD-10-CM | POA: Diagnosis not present

## 2018-01-03 DIAGNOSIS — Z30432 Encounter for removal of intrauterine contraceptive device: Secondary | ICD-10-CM | POA: Diagnosis not present

## 2018-01-14 DIAGNOSIS — S8254XA Nondisplaced fracture of medial malleolus of right tibia, initial encounter for closed fracture: Secondary | ICD-10-CM | POA: Diagnosis not present

## 2018-01-16 DIAGNOSIS — M170 Bilateral primary osteoarthritis of knee: Secondary | ICD-10-CM | POA: Diagnosis not present

## 2018-01-16 DIAGNOSIS — R7303 Prediabetes: Secondary | ICD-10-CM | POA: Diagnosis not present

## 2018-01-16 DIAGNOSIS — J309 Allergic rhinitis, unspecified: Secondary | ICD-10-CM | POA: Diagnosis not present

## 2018-01-16 DIAGNOSIS — I10 Essential (primary) hypertension: Secondary | ICD-10-CM | POA: Diagnosis not present

## 2018-01-16 DIAGNOSIS — E7800 Pure hypercholesterolemia, unspecified: Secondary | ICD-10-CM | POA: Diagnosis not present

## 2018-01-16 DIAGNOSIS — K219 Gastro-esophageal reflux disease without esophagitis: Secondary | ICD-10-CM | POA: Diagnosis not present

## 2018-01-22 MED FILL — DEXILANT DR 60 MG CAPSULE: 60 | 90 days supply | Qty: 90 | Fill #0

## 2018-01-28 DIAGNOSIS — S8254XD Nondisplaced fracture of medial malleolus of right tibia, subsequent encounter for closed fracture with routine healing: Secondary | ICD-10-CM | POA: Diagnosis not present

## 2018-02-14 DIAGNOSIS — R748 Abnormal levels of other serum enzymes: Secondary | ICD-10-CM | POA: Diagnosis not present

## 2018-02-18 ENCOUNTER — Other Ambulatory Visit: Payer: Self-pay | Admitting: Family Medicine

## 2018-02-18 DIAGNOSIS — R748 Abnormal levels of other serum enzymes: Secondary | ICD-10-CM

## 2018-02-18 MED FILL — POTASSIUM CL ER 20 MEQ TABL: 20 | 90 days supply | Qty: 90 | Fill #0

## 2018-02-18 MED FILL — VITAMIN D-3 2,000 UNIT TAB: 50 MCG | 90 days supply | Qty: 90 | Fill #0

## 2018-02-20 MED FILL — DEXILANT DR 60 MG CAPSULE: 60 | 90 days supply | Qty: 90 | Fill #0

## 2018-02-22 ENCOUNTER — Ambulatory Visit
Admission: RE | Admit: 2018-02-22 | Discharge: 2018-02-22 | Disposition: A | Discharge status: Home / Self Care | Payer: 59 | Source: Ambulatory Visit | Attending: Family Medicine | Admitting: Family Medicine

## 2018-02-22 DIAGNOSIS — R748 Abnormal levels of other serum enzymes: Secondary | ICD-10-CM

## 2018-02-22 DIAGNOSIS — K7689 Other specified diseases of liver: Secondary | ICD-10-CM | POA: Diagnosis not present

## 2018-02-25 ENCOUNTER — Other Ambulatory Visit: Payer: Self-pay | Admitting: Family Medicine

## 2018-02-25 DIAGNOSIS — R9389 Abnormal findings on diagnostic imaging of other specified body structures: Secondary | ICD-10-CM

## 2018-02-28 DIAGNOSIS — S8254XD Nondisplaced fracture of medial malleolus of right tibia, subsequent encounter for closed fracture with routine healing: Secondary | ICD-10-CM | POA: Diagnosis not present

## 2018-03-10 ENCOUNTER — Ambulatory Visit
Admission: RE | Admit: 2018-03-10 | Discharge: 2018-03-10 | Disposition: A | Discharge status: Home / Self Care | Payer: 59 | Source: Ambulatory Visit | Attending: Family Medicine | Admitting: Family Medicine

## 2018-03-10 DIAGNOSIS — K760 Fatty (change of) liver, not elsewhere classified: Secondary | ICD-10-CM | POA: Diagnosis not present

## 2018-03-10 DIAGNOSIS — R9389 Abnormal findings on diagnostic imaging of other specified body structures: Secondary | ICD-10-CM

## 2018-03-10 MED ORDER — GADOBENATE DIMEGLUMINE 529 MG/ML IV SOLN
20.0000 mL | Freq: Once | INTRAVENOUS | Status: AC | PRN
  Administered 2018-03-10: 20 mL via INTRAVENOUS

## 2018-03-12 MED FILL — ATORVASTATIN 10 MG TABLET: 10 | 90 days supply | Qty: 90 | Fill #0

## 2018-03-12 MED FILL — ATENOLOL/CHLORTHAL 50/25: 50-25 | 90 days supply | Qty: 90 | Fill #0

## 2018-03-18 DIAGNOSIS — E876 Hypokalemia: Secondary | ICD-10-CM | POA: Diagnosis not present

## 2018-03-19 MED FILL — AMLODIPINE BESYLATE 5 MG TA: 5 | 30 days supply | Qty: 30 | Fill #0

## 2018-04-05 ENCOUNTER — Other Ambulatory Visit: Payer: Self-pay | Admitting: Family Medicine

## 2018-04-05 ENCOUNTER — Ambulatory Visit
Admission: RE | Admit: 2018-04-05 | Discharge: 2018-04-05 | Disposition: A | Discharge status: Home / Self Care | Payer: 59 | Source: Ambulatory Visit | Attending: Family Medicine | Admitting: Family Medicine

## 2018-04-05 DIAGNOSIS — I10 Essential (primary) hypertension: Secondary | ICD-10-CM | POA: Diagnosis not present

## 2018-04-05 DIAGNOSIS — E876 Hypokalemia: Secondary | ICD-10-CM | POA: Diagnosis not present

## 2018-04-05 DIAGNOSIS — R609 Edema, unspecified: Secondary | ICD-10-CM | POA: Diagnosis not present

## 2018-04-05 DIAGNOSIS — R0602 Shortness of breath: Secondary | ICD-10-CM | POA: Diagnosis not present

## 2018-04-05 DIAGNOSIS — R0600 Dyspnea, unspecified: Secondary | ICD-10-CM

## 2018-04-05 MED FILL — NYSTATIN-TRIAMCINOLONE CRM: 100000-0.1 | 10 days supply | Qty: 30 | Fill #0

## 2018-04-09 MED FILL — SPIRONOLACTONE 25 MG TABS: 25 | 30 days supply | Qty: 30 | Fill #0

## 2018-05-08 DIAGNOSIS — K219 Gastro-esophageal reflux disease without esophagitis: Secondary | ICD-10-CM | POA: Diagnosis not present

## 2018-05-08 DIAGNOSIS — R05 Cough: Secondary | ICD-10-CM | POA: Diagnosis not present

## 2018-05-08 DIAGNOSIS — I10 Essential (primary) hypertension: Secondary | ICD-10-CM | POA: Diagnosis not present

## 2018-05-20 MED FILL — SPIRONOLACTONE 25 MG TABS: 25 | 90 days supply | Qty: 90 | Fill #0

## 2018-06-24 MED FILL — ATORVASTATIN 10 MG TABLET: 10 | 90 days supply | Qty: 90 | Fill #1

## 2018-07-10 MED FILL — VITAMIN D-3 2,000 UNIT TAB: 50 MCG | 90 days supply | Qty: 90 | Fill #1

## 2018-08-01 MED FILL — MOMETASONE FUROATE 50 MCG S: 50 | 30 days supply | Qty: 17 | Fill #0

## 2018-08-01 MED FILL — DEXILANT DR 60 MG CAPSULE: 60 | 90 days supply | Qty: 90 | Fill #1

## 2018-08-01 MED FILL — SPIRONOLACTONE 25 MG TABS: 25 | 90 days supply | Qty: 90 | Fill #1

## 2018-09-19 MED FILL — ATORVASTATIN 10 MG TABLET: 10 | 90 days supply | Qty: 90 | Fill #2

## 2018-10-02 MED FILL — VITAMIN D-3 2,000 UNIT TAB: 50 MCG | 90 days supply | Qty: 90 | Fill #2

## 2018-11-28 MED FILL — SPIRONOLACTONE 25 MG TABS: 25 | 90 days supply | Qty: 90 | Fill #2

## 2018-12-15 MED FILL — ATORVASTATIN 10 MG TABLET: 10 | 90 days supply | Qty: 90 | Fill #3

## 2018-12-16 DIAGNOSIS — Z6841 Body Mass Index (BMI) 40.0 and over, adult: Secondary | ICD-10-CM | POA: Diagnosis not present

## 2018-12-16 DIAGNOSIS — Z01419 Encounter for gynecological examination (general) (routine) without abnormal findings: Secondary | ICD-10-CM | POA: Diagnosis not present

## 2018-12-16 DIAGNOSIS — Z1151 Encounter for screening for human papillomavirus (HPV): Secondary | ICD-10-CM | POA: Diagnosis not present

## 2018-12-16 DIAGNOSIS — Z1231 Encounter for screening mammogram for malignant neoplasm of breast: Secondary | ICD-10-CM | POA: Diagnosis not present

## 2019-01-17 DIAGNOSIS — M170 Bilateral primary osteoarthritis of knee: Secondary | ICD-10-CM | POA: Diagnosis not present

## 2019-01-17 DIAGNOSIS — I10 Essential (primary) hypertension: Secondary | ICD-10-CM | POA: Diagnosis not present

## 2019-01-17 DIAGNOSIS — Z23 Encounter for immunization: Secondary | ICD-10-CM | POA: Diagnosis not present

## 2019-01-17 DIAGNOSIS — R7303 Prediabetes: Secondary | ICD-10-CM | POA: Diagnosis not present

## 2019-01-17 DIAGNOSIS — J309 Allergic rhinitis, unspecified: Secondary | ICD-10-CM | POA: Diagnosis not present

## 2019-01-17 DIAGNOSIS — K219 Gastro-esophageal reflux disease without esophagitis: Secondary | ICD-10-CM | POA: Diagnosis not present

## 2019-01-17 DIAGNOSIS — Z6841 Body Mass Index (BMI) 40.0 and over, adult: Secondary | ICD-10-CM | POA: Diagnosis not present

## 2019-01-17 DIAGNOSIS — E7800 Pure hypercholesterolemia, unspecified: Secondary | ICD-10-CM | POA: Diagnosis not present

## 2019-01-17 MED FILL — MONTELUKAST SOD 10 MG TAB: 10 | 90 days supply | Qty: 90 | Fill #0

## 2019-01-17 MED FILL — ATENOLOL 50 MG TABLET: 50 | 30 days supply | Qty: 30 | Fill #0

## 2019-01-20 MED FILL — VITAMIN D-3 2,000 UNIT TAB: 50 MCG | 90 days supply | Qty: 90 | Fill #3

## 2019-02-04 ENCOUNTER — Other Ambulatory Visit: Payer: Self-pay

## 2019-02-04 ENCOUNTER — Encounter: Payer: Self-pay | Admitting: Family Medicine

## 2019-02-04 ENCOUNTER — Ambulatory Visit (INDEPENDENT_AMBULATORY_CARE_PROVIDER_SITE_OTHER): Payer: 59 | Admitting: Family Medicine

## 2019-02-04 ENCOUNTER — Encounter (INDEPENDENT_AMBULATORY_CARE_PROVIDER_SITE_OTHER): Payer: Self-pay | Admitting: Family Medicine

## 2019-02-04 VITALS — BP 129/82 | HR 53 | Temp 98.1°F | Ht 62.0 in | Wt 243.0 lb

## 2019-02-04 DIAGNOSIS — R739 Hyperglycemia, unspecified: Secondary | ICD-10-CM | POA: Diagnosis not present

## 2019-02-04 DIAGNOSIS — E7849 Other hyperlipidemia: Secondary | ICD-10-CM

## 2019-02-04 DIAGNOSIS — E66813 Obesity, class 3: Secondary | ICD-10-CM

## 2019-02-04 DIAGNOSIS — Z9189 Other specified personal risk factors, not elsewhere classified: Secondary | ICD-10-CM

## 2019-02-04 DIAGNOSIS — I10 Essential (primary) hypertension: Secondary | ICD-10-CM | POA: Diagnosis not present

## 2019-02-04 DIAGNOSIS — Z0289 Encounter for other administrative examinations: Secondary | ICD-10-CM

## 2019-02-04 DIAGNOSIS — E559 Vitamin D deficiency, unspecified: Secondary | ICD-10-CM

## 2019-02-04 DIAGNOSIS — R0602 Shortness of breath: Secondary | ICD-10-CM

## 2019-02-04 DIAGNOSIS — Z1331 Encounter for screening for depression: Secondary | ICD-10-CM | POA: Diagnosis not present

## 2019-02-04 DIAGNOSIS — R5383 Other fatigue: Secondary | ICD-10-CM

## 2019-02-04 DIAGNOSIS — Z6841 Body Mass Index (BMI) 40.0 and over, adult: Secondary | ICD-10-CM

## 2019-02-05 LAB — CBC WITH DIFFERENTIAL/PLATELET
Basophils Absolute: 0 10*3/uL (ref 0.0–0.2)
Basos: 0 %
EOS (ABSOLUTE): 0.2 10*3/uL (ref 0.0–0.4)
Eos: 2 %
Hematocrit: 36.9 % (ref 34.0–46.6)
Hemoglobin: 11.6 g/dL (ref 11.1–15.9)
Immature Grans (Abs): 0 10*3/uL (ref 0.0–0.1)
Immature Granulocytes: 0 %
Lymphocytes Absolute: 3.3 10*3/uL — ABNORMAL HIGH (ref 0.7–3.1)
Lymphs: 34 %
MCH: 25.1 pg — ABNORMAL LOW (ref 26.6–33.0)
MCHC: 31.4 g/dL — ABNORMAL LOW (ref 31.5–35.7)
MCV: 80 fL (ref 79–97)
Monocytes Absolute: 0.6 10*3/uL (ref 0.1–0.9)
Monocytes: 6 %
Neutrophils Absolute: 5.5 10*3/uL (ref 1.4–7.0)
Neutrophils: 58 %
Platelets: 315 10*3/uL (ref 150–450)
RBC: 4.63 x10E6/uL (ref 3.77–5.28)
RDW: 14.3 % (ref 11.7–15.4)
WBC: 9.7 10*3/uL (ref 3.4–10.8)

## 2019-02-05 LAB — VITAMIN D 25 HYDROXY (VIT D DEFICIENCY, FRACTURES): Vit D, 25-Hydroxy: 39.4 ng/mL (ref 30.0–100.0)

## 2019-02-05 LAB — COMPREHENSIVE METABOLIC PANEL
ALT: 43 IU/L — ABNORMAL HIGH (ref 0–32)
AST: 28 IU/L (ref 0–40)
Albumin/Globulin Ratio: 1.2 (ref 1.2–2.2)
Albumin: 4.2 g/dL (ref 3.8–4.9)
Alkaline Phosphatase: 198 IU/L — ABNORMAL HIGH (ref 39–117)
BUN/Creatinine Ratio: 19 (ref 9–23)
BUN: 18 mg/dL (ref 6–24)
Bilirubin Total: 0.5 mg/dL (ref 0.0–1.2)
CO2: 23 mmol/L (ref 20–29)
Calcium: 10.1 mg/dL (ref 8.7–10.2)
Chloride: 102 mmol/L (ref 96–106)
Creatinine, Ser: 0.94 mg/dL (ref 0.57–1.00)
GFR calc Af Amer: 78 mL/min/{1.73_m2} (ref >59)
GFR calc non Af Amer: 68 mL/min/{1.73_m2} (ref >59)
Globulin, Total: 3.5 g/dL (ref 1.5–4.5)
Glucose: 128 mg/dL — ABNORMAL HIGH (ref 65–99)
Potassium: 4.3 mmol/L (ref 3.5–5.2)
Sodium: 138 mmol/L (ref 134–144)
Total Protein: 7.7 g/dL (ref 6.0–8.5)

## 2019-02-05 LAB — T3: T3, Total: 98 ng/dL (ref 71–180)

## 2019-02-05 LAB — HEMOGLOBIN A1C
Est. average glucose Bld gHb Est-mCnc: 131 mg/dL
Hgb A1c MFr Bld: 6.2 % — ABNORMAL HIGH (ref 4.8–5.6)

## 2019-02-05 LAB — TSH: TSH: 1.24 u[IU]/mL (ref 0.450–4.500)

## 2019-02-05 LAB — T4, FREE: Free T4: 1.22 ng/dL (ref 0.82–1.77)

## 2019-02-05 LAB — LIPID PANEL WITH LDL/HDL RATIO
Cholesterol, Total: 176 mg/dL (ref 100–199)
HDL: 56 mg/dL (ref >39)
LDL Chol Calc (NIH): 106 mg/dL — ABNORMAL HIGH (ref 0–99)
LDL/HDL Ratio: 1.9 ratio (ref 0.0–3.2)
Triglycerides: 73 mg/dL (ref 0–149)
VLDL Cholesterol Cal: 14 mg/dL (ref 5–40)

## 2019-02-05 LAB — FOLATE: Folate: 5.9 ng/mL (ref >3.0)

## 2019-02-05 LAB — VITAMIN B12: Vitamin B-12: 763 pg/mL (ref 232–1245)

## 2019-02-05 LAB — INSULIN, RANDOM: INSULIN: 14.9 u[IU]/mL (ref 2.6–24.9)

## 2019-02-06 NOTE — Progress Notes (Signed)
Office: [phone]  /  Fax: [phone]   Dear Dr. Marisue [name],   Thank you for referring Leslie [name] to our clinic. The following note includes my evaluation and treatment recommendations.  HPI:   Chief Complaint: OBESITY    Leslie [name] has been referred by Leslie Arabian, MD for consultation regarding her obesity and obesity related comorbidities.    Leslie [name] (MR# [id number]) is a 58 y.o. female who presents on 02/04/2019 for obesity evaluation and treatment. Current BMI is Body mass index is 44.45 kg/m. Leslie [name] has been struggling with her weight for many years and has been unsuccessful in either losing weight, maintaining weight loss, or reaching her healthy weight goal.     Leslie [name] is from [place] and she eats large amounts of rice daily, which she thinks is affecting her weight.     Leslie [name] attended our information session and states she is currently in the action stage of change and ready to dedicate time achieving and maintaining a healthier weight. Leslie [name] is interested in becoming our patient and working on intensive lifestyle modifications including (but not limited to) diet, exercise and weight loss.    Leslie [name] states her family eats meals together her desired weight loss is 63 lbs she started gaining weight after her divorce her heaviest weight ever was 244 lbs she is a picky eater and doesn't like to eat healthier foods  she has significant food cravings issues  she snacks frequently in the evenings she skips meals frequently she is frequently drinking liquids with calories she frequently makes poor food choices she struggles with emotional eating    Fatigue Leslie [name] feels her energy is lower than it should be. This has worsened with weight gain and has not worsened recently. Leslie [name] admits to daytime somnolence and  admits to waking up still tired. Patient is at risk for obstructive sleep apnea. Patent has a history of symptoms of daytime fatigue and  morning headache. Patient generally gets 4 hours of sleep per night, and states they generally have nightime awakenings. Snoring is present. Apneic episodes are present. Epworth Sleepiness Score is 15.  Dyspnea on exertion Leslie [name] notes increasing shortness of breath with exercising and seems to be worsening over time with weight gain. She notes getting out of breath sooner with activity than she used to. This has not gotten worse recently. Leslie [name] denies orthopnea.  Hyperlipidemia Leslie [name] has hyperlipidemia and she is on Lipitor. She doesn't have any recent labs in [place]. She would like to improve her cholesterol levels with intensive lifestyle modification including a low saturated fat diet, exercise and weight loss. She denies any chest pain, claudication or myalgias.  Vitamin D Deficiency Leslie [name] has a diagnosis of vitamin D deficiency. She is on Vit D and doesn't have any recent labs. She notes fatigue and denies nausea, vomiting or muscle weakness.  Hypertension Leslie [name] is a 58 y.o. female with hypertension. Leslie [name]'s blood pressure is stable on medications. She denies chest pain, headaches, or dizziness. She is working on weight loss to help control her blood pressure with the goal of decreasing her risk of heart attack and stroke.   Hyperglycemia Leslie [name] has a history of some elevated blood glucose readings, and has been told she has pre-diabetes. She doesn't have any recent labs. She notes polyphagia.   At risk for diabetes Leslie [name] is at higher than average risk for developing diabetes due to her obesity and hyperglycemia. She currently denies polyuria or polydipsia.  Leslie [name]  and Mood (modified PHQ-9) score was  Depression screen PHQ 2/9 02/04/2019  Decreased Interest 3  Down, Depressed, Hopeless 3  PHQ - 2 Score 6  Altered sleeping 3  Tired, decreased energy 3  Change in appetite 3  Feeling bad or failure about yourself  3  Trouble concentrating  2  Moving slowly or fidgety/restless 1  Suicidal thoughts 0  PHQ-9 Score 21  Difficult doing work/chores Not difficult at all    ASSESSMENT AND PLAN:  Other fatigue - Plan: EKG 12-Lead, Vitamin B12, CBC with Differential/Platelet, T3, T4, free, TSH, Folate  Shortness of breath on exertion  Other hyperlipidemia - Plan: Lipid Panel With LDL/HDL Ratio  Vitamin D deficiency - Plan: Vitamin B12, VITAMIN D 25 Hydroxy (Vit-D Deficiency, Fractures)  Essential hypertension  Hyperglycemia - Plan: Comprehensive metabolic panel, Hemoglobin A1c, Insulin, random  Depression screening  At risk for diabetes mellitus  Class 3 severe obesity with serious comorbidity and body mass index (BMI) of 40.0 to 44.9 in adult, unspecified obesity type (HCC)  PLAN:  Fatigue Leslie [name] was informed that her fatigue may be related to obesity, depression or many other causes. Labs will be ordered, and in the meanwhile Leslie [name] has agreed to work on diet, exercise and weight loss to help with fatigue. Proper sleep hygiene was discussed including the need for 7-8 hours of quality sleep each night. A sleep study was not ordered based on symptoms and Epworth score.  Dyspnea on exertion Leslie [name]'s shortness of breath appears to be obesity related and exercise induced. She has agreed to work on weight loss and gradually increase exercise to treat her exercise induced shortness of breath. If Leslie [name] follows our instructions and loses weight without improvement of her shortness of breath, we will plan to refer to pulmonology. We will monitor this condition regularly. Leslie [name] agrees to this plan.  Hyperlipidemia Leslie [name] was informed of the American Heart Association Guidelines emphasizing intensive lifestyle modifications as the first line treatment for hyperlipidemia. We discussed many lifestyle modifications today in depth, and Leslie [name] will continue to work on decreasing saturated fats such as fatty red meat, butter and  many fried foods. She will also increase vegetables and lean protein in her diet and continue to work on exercise and weight loss efforts. Malisha will start her diet and we will check labs today.  Vitamin D Deficiency Iraida was informed that low vitamin D levels contributes to fatigue and are associated with obesity, breast, and colon cancer. Sharanya agrees to continue taking Vit D and will follow up for routine testing of vitamin D, at least 2-3 times per year. She was informed of the risk of over-replacement of vitamin D and agrees to not increase her dose unless she discusses this with [place] first. We will check labs today. Melodey agrees to follow up with our clinic in 2 weeks.  Hypertension We discussed sodium restriction, working on healthy weight loss, and a regular exercise program as the means to achieve improved blood pressure control. Leslie [name] agreed with this plan and agreed to follow up as directed. We will continue to monitor her blood pressure as well as her progress with the above lifestyle modifications. She will continue her medications as prescribed and will watch for signs of hypotension as she continues her lifestyle modifications. Rosielee will start her diet prescription and we will check labs today. Tasnim agrees to follow up with our clinic in 2 weeks.  Hyperglycemia Fasting labs will be obtained today and results with be discussed with  Leslie [name] in 2 weeks at her follow up visit. In the meanwhile Leslie [name] was started on a lower simple carbohydrate diet prescription and will work on weight loss efforts.  Diabetes risk counseling Leslie [name] was given extended (15 minutes) diabetes prevention counseling today. She is 58 y.o. female and has risk factors for diabetes including obesity and hyperglycemia. We discussed intensive lifestyle modifications today with an emphasis on weight loss as well as increasing exercise and decreasing simple carbohydrates in her diet.  Depression Screen  Leslie [name] had a strongly positive depression screening. Depression is commonly associated with obesity and often results in emotional eating behaviors. We will monitor this closely and work on CBT to help improve the non-hunger eating patterns. Referral to Psychology may be required if no improvement is seen as she continues in our clinic.  Obesity Leslie [name] is currently in the action stage of change and her goal is to continue with weight loss efforts. I recommend Leslie [name] begin the structured treatment plan as follows:  She has agreed to follow the Category 2 plan + 100 calories Leslie [name] has been instructed to eventually work up to a goal of 150 minutes of combined cardio and strengthening exercise per week for weight loss and overall health benefits. We discussed the following Behavioral Modification Strategies today: increasing lean protein intake, decreasing simple carbohydrates, increasing vegetables, and work on meal planning and easy cooking plans   She was informed of the importance of frequent follow up visits to maximize her success with intensive lifestyle modifications for her multiple health conditions. She was informed we would discuss her lab results at her next visit unless there is a critical issue that needs to be addressed sooner. Leslie [name] agreed to keep her next visit at the agreed upon time to discuss these results.  ALLERGIES: No Known Allergies  MEDICATIONS: Current Outpatient Medications on File Prior to Visit  Medication Sig Dispense Refill  . atenolol-chlorthalidone (TENORETIC) 50-25 MG per tablet Take 1 tablet by mouth daily.    [name] atorvastatin (LIPITOR) 10 MG tablet Take 10 mg by mouth daily.    . cetirizine (ZYRTEC) 10 MG tablet Take 10 mg by mouth as needed for allergies.    . Cholecalciferol (VITAMIN D) 50 MCG (2000 UT) CAPS Take 1 capsule by mouth daily.    [name] dexlansoprazole (DEXILANT) 60 MG capsule Take 60 mg by mouth daily.    . naproxen sodium (ALEVE) 220 MG tablet  Take 220 mg by mouth daily as needed.    [name] spironolactone (ALDACTONE) 50 MG tablet Take 50 mg by mouth daily.     No current facility-administered medications on file prior to visit.     PAST MEDICAL HISTORY: Past Medical History:  Diagnosis Date  . Allergy   . Anemia   . Arthritis   . Dyspnea   . GERD (gastroesophageal reflux disease)   . Hyperlipidemia   . Hypertension   . Joint pain   . Lower extremity edema   . Prediabetes   . Sickle cell trait ([place])     PAST SURGICAL HISTORY: Past Surgical History:  Procedure Laterality Date  . cyst on back     benign  . cyst on neck    . GANGLION CYST EXCISION     left hand  . WRIST SURGERY      SOCIAL HISTORY: Social History   Tobacco Use  . Smoking status: Never Smoker  . Smokeless tobacco: Never Used  Substance Use Topics  . Alcohol use: No  Comment: occ  . Drug use: No    FAMILY HISTORY: Family History  Problem Relation Age of Onset  . Breast cancer Mother   . Hypertension Mother   . Heart disease Mother   . Sleep apnea Mother     ROS: Review of Systems  Constitutional: Positive for chills, fever and malaise/fatigue. Negative for weight loss.       + Trouble sleeping  Respiratory: Positive for shortness of breath (with exertion).   Cardiovascular: Negative for chest pain, orthopnea and claudication.       + Sudden awakening from sleep with shortness of breath + Leg cramping  Gastrointestinal: Negative for nausea and vomiting.  Genitourinary: Negative for frequency.  Musculoskeletal: Negative for myalgias.       Negative muscle weakness  Skin: Positive for itching.       + Dryness  Neurological: Negative for dizziness and headaches.  Endo/Heme/Allergies: Negative for polydipsia.       Negative hypoglycemia Positive polyphagia  Psychiatric/Behavioral:       + Stress    PHYSICAL EXAM: Blood pressure 129/82, pulse (!) 53, temperature 98.1 F (36.7 C), temperature source Oral, height 5\' 2"  (1.575  m), weight 243 lb (110.2 kg), last menstrual period 12/17/2018, SpO2 100 %. Body mass index is 44.45 kg/m. Physical Exam Vitals signs reviewed.  Constitutional:      Appearance: Normal appearance. She is obese.  HENT:     Head: Normocephalic and atraumatic.     Nose: Nose normal.  Eyes:     General: No scleral icterus.    Extraocular Movements: Extraocular movements intact.  Neck:     Musculoskeletal: Normal range of motion and neck supple.     Comments: No thyromegaly present Cardiovascular:     Rate and Rhythm: Regular rhythm. Bradycardia present.     Pulses: Normal pulses.     Heart sounds: Normal heart sounds.  Pulmonary:     Effort: Pulmonary effort is normal. No respiratory distress.     Breath sounds: Normal breath sounds.  Abdominal:     Palpations: Abdomen is soft.     Tenderness: There is no abdominal tenderness.     Comments: + Obesity  Musculoskeletal: Normal range of motion.     Right lower leg: No edema.     Left lower leg: No edema.  Skin:    General: Skin is warm and dry.  Neurological:     Mental Status: She is alert and oriented to person, place, and time.     Coordination: Coordination normal.  Psychiatric:        Mood and Affect: Mood normal.        Behavior: Behavior normal.     RECENT LABS AND TESTS: BMET    Component Value Date/Time   NA 138 02/04/2019 0915   K 4.3 02/04/2019 0915   CL 102 02/04/2019 0915   CO2 23 02/04/2019 0915   GLUCOSE 128 (H) 02/04/2019 0915   GLUCOSE 108 (H) 03/21/2010 1150   BUN 18 02/04/2019 0915   CREATININE 0.94 02/04/2019 0915   CALCIUM 10.1 02/04/2019 0915   GFRNONAA 68 02/04/2019 0915   GFRAA 78 02/04/2019 0915   Lab Results  Component Value Date   HGBA1C 6.2 (H) 02/04/2019   Lab Results  Component Value Date   INSULIN 14.9 02/04/2019   CBC    Component Value Date/Time   WBC 9.7 02/04/2019 0915   RBC 4.63 02/04/2019 0915   HGB 11.6 02/04/2019 0915   HCT 36.9  02/04/2019 0915   PLT 315  02/04/2019 0915   MCV 80 02/04/2019 0915   MCH 25.1 (L) 02/04/2019 0915   MCHC 31.4 (L) 02/04/2019 0915   RDW 14.3 02/04/2019 0915   LYMPHSABS 3.3 (H) 02/04/2019 0915   EOSABS 0.2 02/04/2019 0915   BASOSABS 0.0 02/04/2019 0915   Iron/TIBC/Ferritin/ %Sat No results found for: IRON, TIBC, FERRITIN, IRONPCTSAT Lipid Panel     Component Value Date/Time   CHOL 176 02/04/2019 0915   TRIG 73 02/04/2019 0915   HDL 56 02/04/2019 0915   LDLCALC 106 (H) 02/04/2019 0915   Hepatic Function Panel     Component Value Date/Time   PROT 7.7 02/04/2019 0915   ALBUMIN 4.2 02/04/2019 0915   AST 28 02/04/2019 0915   ALT 43 (H) 02/04/2019 0915   ALKPHOS 198 (H) 02/04/2019 0915   BILITOT 0.5 02/04/2019 0915      Component Value Date/Time   TSH 1.240 02/04/2019 0915    ECG  shows NSR with a rate of 56 BPM INDIRECT CALORIMETER done today shows a VO2 of 228 and a REE of 1590.  Her calculated basal metabolic rate is [id number] thus her basal metabolic rate is worse than expected.       OBESITY BEHAVIORAL INTERVENTION VISIT  Today's visit was # 1   Starting weight: 243 lbs Starting date: 02/04/2019 Today's weight : 243 lbs Today's date: 02/04/2019 Total lbs lost to date: 0    ASK: We discussed the diagnosis of obesity with Leslie [name] today and [place] agreed to give [place] permission to discuss obesity behavioral modification therapy today.  ASSESS: Kaelani has the diagnosis of obesity and her BMI today is 44.43 Getsemani is in the action stage of change   ADVISE: [name] was educated on the multiple health risks of obesity as well as the benefit of weight loss to improve her health. She was advised of the need for long term treatment and the importance of lifestyle modifications to improve her current health and to decrease her risk of future health problems.  AGREE: Multiple dietary modification options and treatment options were discussed and  Audria agreed to follow the  recommendations documented in the above note.  ARRANGE: Briarrose was educated on the importance of frequent visits to treat obesity as outlined per CMS and USPSTF guidelines and agreed to schedule her next follow up appointment today.  I, Trixie Dredge, am acting as transcriptionist for Dennard Nip, MD I have reviewed the above documentation for accuracy and completeness, and I agree with the above. -Dennard Nip, MD

## 2019-02-10 MED FILL — ATENOLOL 50 MG TABLET: 50 | 30 days supply | Qty: 30 | Fill #1

## 2019-02-14 DIAGNOSIS — I10 Essential (primary) hypertension: Secondary | ICD-10-CM | POA: Diagnosis not present

## 2019-02-14 MED FILL — NYSTATIN-TRIAMCINOLONE CRM: 100000-0.1 | 10 days supply | Qty: 30 | Fill #0

## 2019-02-18 ENCOUNTER — Encounter (INDEPENDENT_AMBULATORY_CARE_PROVIDER_SITE_OTHER): Payer: Self-pay | Admitting: Family Medicine

## 2019-02-18 ENCOUNTER — Ambulatory Visit (INDEPENDENT_AMBULATORY_CARE_PROVIDER_SITE_OTHER): Payer: 59 | Admitting: Family Medicine

## 2019-02-18 ENCOUNTER — Other Ambulatory Visit: Payer: Self-pay

## 2019-02-18 VITALS — BP 132/80 | HR 59 | Temp 98.1°F | Ht 62.0 in | Wt 241.0 lb

## 2019-02-18 DIAGNOSIS — R7303 Prediabetes: Secondary | ICD-10-CM

## 2019-02-18 DIAGNOSIS — R7989 Other specified abnormal findings of blood chemistry: Secondary | ICD-10-CM

## 2019-02-18 DIAGNOSIS — E7849 Other hyperlipidemia: Secondary | ICD-10-CM | POA: Diagnosis not present

## 2019-02-18 DIAGNOSIS — E559 Vitamin D deficiency, unspecified: Secondary | ICD-10-CM

## 2019-02-18 DIAGNOSIS — Z9189 Other specified personal risk factors, not elsewhere classified: Secondary | ICD-10-CM | POA: Diagnosis not present

## 2019-02-18 DIAGNOSIS — Z6841 Body Mass Index (BMI) 40.0 and over, adult: Secondary | ICD-10-CM | POA: Diagnosis not present

## 2019-02-18 MED ORDER — METFORMIN HCL 500 MG PO TABS: 500.0000 mg | ORAL_TABLET | Freq: Every day | ORAL | 0 refills | Status: DC

## 2019-02-18 MED FILL — metFORMIN HCL 500 MG TABS: 500 | 30 days supply | Qty: 30 | Fill #0

## 2019-02-19 NOTE — Progress Notes (Signed)
Office: [phone]  /  Fax: [phone]   HPI:   Chief Complaint: OBESITY Leslie [name] is here to discuss her progress with her obesity treatment plan. She is on the Category 2 plan + 100 calories and is following her eating plan approximately 60 % of the time. She states she is more mindful of her steps. Leslie [name] has done well with weight loss. She did well with her plan for breakfast and lunch, but found dinner was challenging, and had difficulty eating all of the protein and still ate out at times. She was tempted by day rep meals and tried to make better choices.  Her weight is 241 lb (109.3 kg) today and has had a weight loss of 2 pounds over a period of 2 weeks since her last visit. She has lost 2 lbs since starting treatment with [place].  Elevated LFTs [name] has a diagnosis of elevated ALT. Her BMI is over 40. She has not been told she has elevated LFT in the past. She was hospitalized for jaundice when she was 88-38 years old in [place]. She does not remember her diagnosis. She denies abdominal pain or jaundice. She denies excessive alcohol intake.  Hyperlipidemia [name] has hyperlipidemia and has been trying to improve her cholesterol levels with intensive lifestyle modification including a low saturated fat diet, exercise and weight loss. She is on statin and her LDL is slightly elevated. She denies any chest pain, claudication or myalgias.  Vitamin D Deficiency [name] has a diagnosis of vitamin D deficiency. She is currently taking prescription Vit D, but level is not yet at goal. She denies nausea, vomiting or muscle weakness.  Pre-Diabetes [name] has a diagnosis of pre-diabetes based on her elevated Hgb A1c and was informed this puts her at greater risk of developing diabetes. Her fasting glucose >126, A1c is elevated at 6.2, and fasting insulin is elevated. She is not taking metformin currently and continues to work on diet and exercise to decrease risk of diabetes. She notes  polyphagia and denies hypoglycemia.  At risk for diabetes [name] is at higher than average risk for developing diabetes due to her obesity and pre-diabetes. She currently denies polyuria or polydipsia.  ASSESSMENT AND PLAN:  Elevated LFTs  Other hyperlipidemia  At risk for diabetes mellitus  Vitamin D deficiency  Prediabetes - Plan: metFORMIN (GLUCOPHAGE) 500 MG tablet  Class 3 severe obesity with serious comorbidity and body mass index (BMI) of 40.0 to 44.9 in adult, unspecified obesity type (HCC)  PLAN:  Elevated LFTs We discussed the likely diagnosis of non alcoholic fatty liver disease today and how this condition is obesity related. [name] was educated on her risk of developing NASH or even liver failure and th only proven treatment for NAFLD was weight loss. [name] agreed to continue with her weight loss efforts with healthier diet and exercise as an essential part of her treatment plan. We will recheck labs and hepatitis panel in 2 weeks at her follow up appointment.  Hyperlipidemia [name] was informed of the American Heart Association Guidelines emphasizing intensive lifestyle modifications as the first line treatment for hyperlipidemia. We discussed many lifestyle modifications today in depth, and [name] will continue to work on decreasing saturated fats such as fatty red meat, butter and many fried foods. She will also increase vegetables and lean protein in her diet and continue to work on diet, exercise, and weight loss efforts. We will recheck labs in 3 months.  Vitamin D Deficiency [name] was informed that low vitamin  D levels contributes to fatigue and are associated with obesity, breast, and colon cancer. [name] agrees to continue taking prescription Vit D 50 mcg daily and will follow up for routine testing of vitamin D, at least 2-3 times per year. She was informed of the risk of over-replacement of vitamin D and agrees to not increase her dose unless she discusses  this with [place] first. [name] agrees to follow up with our clinic in 2 weeks.  Pre-Diabetes [name] will continue to work on weight loss, diet, exercise, and decreasing simple carbohydrates in her diet to help decrease the risk of diabetes. We dicussed metformin including benefits and risks. She was informed that eating too many simple carbohydrates or too many calories at one sitting increases the likelihood of GI side effects. [name] agrees to start metformin 500 mg PO q AM #30 with no refills. Karlia agrees to follow up with our clinic in 2 weeks as directed to monitor her progress.  Diabetes risk counseling [name] was given extended (30 minutes) diabetes prevention counseling today. She is 58 y.o. female and has risk factors for diabetes including obesity and pre-diabetes. We discussed intensive lifestyle modifications today with an emphasis on weight loss as well as increasing exercise and decreasing simple carbohydrates in her diet.  Obesity [name] is currently in the action stage of change. As such, her goal is to continue with weight loss efforts She has agreed to follow the Category 2 plan + 100 calories [name] has been instructed to work up to a goal of 150 minutes of combined cardio and strengthening exercise per week for weight loss and overall health benefits. We discussed the following Behavioral Modification Strategies today: increasing lean protein intake, decreasing simple carbohydrates , decrease eating out and work on meal planning and easy cooking plans   [name] has agreed to follow up with our clinic in 2 weeks. She was informed of the importance of frequent follow up visits to maximize her success with intensive lifestyle modifications for her multiple health conditions.  ALLERGIES: No Known Allergies  MEDICATIONS: Current Outpatient Medications on File Prior to Visit  Medication Sig Dispense Refill   atenolol-chlorthalidone (TENORETIC) 50-25 MG per tablet Take 1  tablet by mouth daily.     atorvastatin (LIPITOR) 10 MG tablet Take 10 mg by mouth daily.     cetirizine (ZYRTEC) 10 MG tablet Take 10 mg by mouth as needed for allergies.     Cholecalciferol (VITAMIN D) 50 MCG (2000 UT) CAPS Take 1 capsule by mouth daily.     dexlansoprazole (DEXILANT) 60 MG capsule Take 60 mg by mouth daily.     naproxen sodium (ALEVE) 220 MG tablet Take 220 mg by mouth daily as needed.     spironolactone (ALDACTONE) 50 MG tablet Take 50 mg by mouth daily.     No current facility-administered medications on file prior to visit.     PAST MEDICAL HISTORY: Past Medical History:  Diagnosis Date   Allergy    Anemia    Arthritis    Dyspnea    GERD (gastroesophageal reflux disease)    Hyperlipidemia    Hypertension    Joint pain    Lower extremity edema    Prediabetes    Sickle cell trait ([place])     PAST SURGICAL HISTORY: Past Surgical History:  Procedure Laterality Date   cyst on back     benign   cyst on neck     GANGLION CYST EXCISION     left hand  WRIST SURGERY      SOCIAL HISTORY: Social History   Tobacco Use   Smoking status: Never Smoker   Smokeless tobacco: Never Used  Substance Use Topics   Alcohol use: No    Comment: occ   Drug use: No    FAMILY HISTORY: Family History  Problem Relation Age of Onset   Breast cancer Mother    Hypertension Mother    Heart disease Mother    Sleep apnea Mother     ROS: Review of Systems  Constitutional: Positive for weight loss.  Eyes:       Negative jaundice  Cardiovascular: Negative for chest pain and claudication.  Gastrointestinal: Negative for abdominal pain, nausea and vomiting.  Genitourinary: Negative for frequency.  Musculoskeletal: Negative for myalgias.       Negative muscle weakness  Endo/Heme/Allergies: Negative for polydipsia.       Positive polyphagia Negative hypoglycemia    PHYSICAL EXAM: Blood pressure 132/80, pulse (!) 59, temperature 98.1 F  (36.7 C), temperature source Oral, height 5\' 2"  (1.575 m), weight 241 lb (109.3 kg), last menstrual period 01/19/2019, SpO2 99 %. Body mass index is 44.08 kg/m. Physical Exam Vitals signs reviewed.  Constitutional:      Appearance: Normal appearance. She is obese.  Cardiovascular:     Rate and Rhythm: Normal rate.     Pulses: Normal pulses.  Pulmonary:     Effort: Pulmonary effort is normal.     Breath sounds: Normal breath sounds.  Musculoskeletal: Normal range of motion.  Skin:    General: Skin is warm and dry.  Neurological:     Mental Status: She is alert and oriented to person, place, and time.  Psychiatric:        Mood and Affect: Mood normal.        Behavior: Behavior normal.     RECENT LABS AND TESTS: BMET    Component Value Date/Time   NA 138 02/04/2019 0915   K 4.3 02/04/2019 0915   CL 102 02/04/2019 0915   CO2 23 02/04/2019 0915   GLUCOSE 128 (H) 02/04/2019 0915   GLUCOSE 108 (H) 03/21/2010 1150   BUN 18 02/04/2019 0915   CREATININE 0.94 02/04/2019 0915   CALCIUM 10.1 02/04/2019 0915   GFRNONAA 68 02/04/2019 0915   GFRAA 78 02/04/2019 0915   Lab Results  Component Value Date   HGBA1C 6.2 (H) 02/04/2019   Lab Results  Component Value Date   INSULIN 14.9 02/04/2019   CBC    Component Value Date/Time   WBC 9.7 02/04/2019 0915   RBC 4.63 02/04/2019 0915   HGB 11.6 02/04/2019 0915   HCT 36.9 02/04/2019 0915   PLT 315 02/04/2019 0915   MCV 80 02/04/2019 0915   MCH 25.1 (L) 02/04/2019 0915   MCHC 31.4 (L) 02/04/2019 0915   RDW 14.3 02/04/2019 0915   LYMPHSABS 3.3 (H) 02/04/2019 0915   EOSABS 0.2 02/04/2019 0915   BASOSABS 0.0 02/04/2019 0915   Iron/TIBC/Ferritin/ %Sat No results found for: IRON, TIBC, FERRITIN, IRONPCTSAT Lipid Panel     Component Value Date/Time   CHOL 176 02/04/2019 0915   TRIG 73 02/04/2019 0915   HDL 56 02/04/2019 0915   LDLCALC 106 (H) 02/04/2019 0915   Hepatic Function Panel     Component Value Date/Time   PROT  7.7 02/04/2019 0915   ALBUMIN 4.2 02/04/2019 0915   AST 28 02/04/2019 0915   ALT 43 (H) 02/04/2019 0915   ALKPHOS 198 (H) 02/04/2019 0915  BILITOT 0.5 02/04/2019 0915      Component Value Date/Time   TSH 1.240 02/04/2019 0915      OBESITY BEHAVIORAL INTERVENTION VISIT  Today's visit was # 2   Starting weight: 243 lbs Starting date: 02/04/2019 Today's weight : 241 lbs Today's date: 02/18/2019 Total lbs lost to date: 2    ASK: We discussed the diagnosis of obesity with Jerene Dilling today and Elder Negus agreed to give [place] permission to discuss obesity behavioral modification therapy today.  ASSESS: Jett has the diagnosis of obesity and her BMI today is 44.07 Neviyah is in the action stage of change   ADVISE: Odali was educated on the multiple health risks of obesity as well as the benefit of weight loss to improve her health. She was advised of the need for long term treatment and the importance of lifestyle modifications to improve her current health and to decrease her risk of future health problems.  AGREE: Multiple dietary modification options and treatment options were discussed and  Trinidy agreed to follow the recommendations documented in the above note.  ARRANGE: Jatanna was educated on the importance of frequent visits to treat obesity as outlined per CMS and USPSTF guidelines and agreed to schedule her next follow up appointment today.  I, Trixie Dredge, am acting as transcriptionist for Dennard Nip, MD  I have reviewed the above documentation for accuracy and completeness, and I agree with the above. -Dennard Nip, MD

## 2019-02-22 MED FILL — NYSTATIN-TRIAMCINOLONE CRM: 100000-0.1 | 10 days supply | Qty: 30 | Fill #1

## 2019-03-06 ENCOUNTER — Encounter (INDEPENDENT_AMBULATORY_CARE_PROVIDER_SITE_OTHER): Payer: Self-pay | Admitting: Family Medicine

## 2019-03-06 ENCOUNTER — Other Ambulatory Visit: Payer: Self-pay

## 2019-03-06 ENCOUNTER — Ambulatory Visit (INDEPENDENT_AMBULATORY_CARE_PROVIDER_SITE_OTHER): Payer: 59 | Admitting: Family Medicine

## 2019-03-06 VITALS — BP 112/73 | HR 61 | Temp 98.2°F | Ht 62.0 in | Wt 239.0 lb

## 2019-03-06 DIAGNOSIS — F3289 Other specified depressive episodes: Secondary | ICD-10-CM

## 2019-03-06 DIAGNOSIS — Z9189 Other specified personal risk factors, not elsewhere classified: Secondary | ICD-10-CM | POA: Diagnosis not present

## 2019-03-06 DIAGNOSIS — Z6841 Body Mass Index (BMI) 40.0 and over, adult: Secondary | ICD-10-CM | POA: Diagnosis not present

## 2019-03-06 DIAGNOSIS — R7303 Prediabetes: Secondary | ICD-10-CM

## 2019-03-06 MED ORDER — BUPROPION HCL ER (SR) 150 MG PO TB12: 150.0000 mg | ORAL_TABLET | ORAL | 0 refills | Status: DC

## 2019-03-06 MED FILL — BUPROPION HCL SR 150 MG TAB: 150 | 30 days supply | Qty: 30 | Fill #0

## 2019-03-06 NOTE — Progress Notes (Signed)
Office: [phone]  /  Fax: [phone]   HPI:   Chief Complaint: OBESITY Leslie [name] is here to discuss her progress with her obesity treatment plan. She is on the Category 2 plan and is following her eating plan approximately 65-70% of the time. She states she is being more mindful of her steps. Leslie [name] continues to do well with weight loss. She is getting bored with breakfast and would like more options. She notes increased stress eating this last week as well. Her weight is 239 lb (108.4 kg) today and has had a weight loss of 2 pounds over a period of 2 weeks since her last visit. She has lost 4 lbs since starting treatment with [place].  Pre-Diabetes Leslie [name] has a diagnosis of prediabetes based on her elevated Hgb A1c and was informed this puts her at greater risk of developing diabetes. She started metformin and had GI upset so she stopped taking it. She continues to work on diet and exercise to decrease risk of diabetes. She denies nausea or hypoglycemia. She still notes polyphagia.  At risk for diabetes Leslie [name] is at higher than average risk for developing diabetes due to her obesity. She currently denies polyuria or polydipsia.  Depression with emotional eating behaviors Leslie [name] is struggling with emotional eating and using food for comfort to the extent that it is negatively impacting her health. She often snacks when she is not hungry. Leslie [name] sometimes feels she is out of control and then feels guilty that she made poor food choices. She has been working on behavior modification techniques to help reduce her emotional eating and has been somewhat successful. Leslie [name] notes increased stress and emotional eating, worse with no election results, and worse in the evenings. She shows no sign of suicidal or homicidal ideations.  Depression screen PHQ 2/9 02/04/2019  Decreased Interest 3  Down, Depressed, Hopeless 3  PHQ - 2 Score 6  Altered sleeping 3  Tired, decreased energy 3  Change in  appetite 3  Feeling bad or failure about yourself  3  Trouble concentrating 2  Moving slowly or fidgety/restless 1  Suicidal thoughts 0  PHQ-9 Score 21  Difficult doing work/chores Not difficult at all   ASSESSMENT AND PLAN:  Prediabetes  Other depression - with emotional eating  - Plan: buPROPion (WELLBUTRIN SR) 150 MG 12 hr tablet  At risk for diabetes mellitus  Class 3 severe obesity with serious comorbidity and body mass index (BMI) of 40.0 to 44.9 in adult, unspecified obesity type Leslie [name])  PLAN:  Pre-Diabetes Leslie [name] will continue to work on weight loss, exercise, and decreasing simple carbohydrates in her diet to help decrease the risk of diabetes. We dicussed metformin including benefits and risks. She was informed that eating too many simple carbohydrates or too many calories at one sitting increases the likelihood of GI side effects. [name] will change to half dose of metformin at bedtime and will follow-up with our clinic in 2 weeks to monitor her progress.  Diabetes risk counseling Leslie [name] was given extended (15 minutes) diabetes prevention counseling today. She is 58 y.o. female and has risk factors for diabetes including obesity. We discussed intensive lifestyle modifications today with an emphasis on weight loss as well as increasing exercise and decreasing simple carbohydrates in her diet.  Depression with Emotional Eating Behaviors We discussed behavior modification techniques today to help Leslie [name] deal with her emotional eating and depression. Leslie [name] will start Wellbutrin SR 150 mg QAM and agrees to follow-up with our clinic in 2  weeks.  Obesity Leslie [name] is currently in the action stage of change. As such, her goal is to continue with weight loss efforts. She has agreed to follow the Category 2 plan with breakfast options. Leslie [name] has been instructed to work up to a goal of 150 minutes of combined cardio and strengthening exercise per week for weight loss and overall  health benefits. We discussed the following Behavioral Modification Strategies today: emotional eating strategies.  Leslie [name] has agreed to follow-up with our clinic in 2 weeks. She was informed of the importance of frequent follow-up visits to maximize her success with intensive lifestyle modifications for her multiple health conditions.  ALLERGIES: No Known Allergies  MEDICATIONS: Current Outpatient Medications on File Prior to Visit  Medication Sig Dispense Refill  . atenolol-chlorthalidone (TENORETIC) 50-25 MG per tablet Take 1 tablet by mouth daily.    [name] atorvastatin (LIPITOR) 10 MG tablet Take 10 mg by mouth daily.    . cetirizine (ZYRTEC) 10 MG tablet Take 10 mg by mouth as needed for allergies.    . Cholecalciferol (VITAMIN D) 50 MCG (2000 UT) CAPS Take 1 capsule by mouth daily.    [name] dexlansoprazole (DEXILANT) 60 MG capsule Take 60 mg by mouth daily.    . metFORMIN (GLUCOPHAGE) 500 MG tablet Take 1 tablet (500 mg total) by mouth daily with breakfast. 30 tablet 0  . naproxen sodium (ALEVE) 220 MG tablet Take 220 mg by mouth daily as needed.    [name] spironolactone (ALDACTONE) 50 MG tablet Take 50 mg by mouth daily.     No current facility-administered medications on file prior to visit.     PAST MEDICAL HISTORY: Past Medical History:  Diagnosis Date  . Allergy   . Anemia   . Arthritis   . Dyspnea   . GERD (gastroesophageal reflux disease)   . Hyperlipidemia   . Hypertension   . Joint pain   . Lower extremity edema   . Prediabetes   . Sickle cell trait ([place])     PAST SURGICAL HISTORY: Past Surgical History:  Procedure Laterality Date  . cyst on back     benign  . cyst on neck    . GANGLION CYST EXCISION     left hand  . WRIST SURGERY      SOCIAL HISTORY: Social History   Tobacco Use  . Smoking status: Never Smoker  . Smokeless tobacco: Never Used  Substance Use Topics  . Alcohol use: No    Comment: occ  . Drug use: No    FAMILY HISTORY: Family History   Problem Relation Age of Onset  . Breast cancer Mother   . Hypertension Mother   . Heart disease Mother   . Sleep apnea Mother    ROS: Review of Systems  Gastrointestinal: Negative for nausea.  Endo/Heme/Allergies:       Negative for hypoglycemia. Positive for polyphagia.  Psychiatric/Behavioral: Positive for depression (emotional eating). Negative for suicidal ideas.       Negative for homicidal ideas.   PHYSICAL EXAM: Blood pressure 112/73, pulse 61, temperature 98.2 F (36.8 C), temperature source Oral, height 5\' 2"  (1.575 m), weight 239 lb (108.4 kg), SpO2 98 %. Body mass index is 43.71 kg/m. Physical Exam Vitals signs reviewed.  Constitutional:      Appearance: Normal appearance. She is obese.  Cardiovascular:     Rate and Rhythm: Normal rate.     Pulses: Normal pulses.  Pulmonary:     Effort: Pulmonary effort is normal.  Breath sounds: Normal breath sounds.  Musculoskeletal: Normal range of motion.  Skin:    General: Skin is warm and dry.  Neurological:     Mental Status: She is alert and oriented to person, place, and time.  Psychiatric:        Behavior: Behavior normal.   RECENT LABS AND TESTS: BMET    Component Value Date/Time   NA 138 02/04/2019 0915   K 4.3 02/04/2019 0915   CL 102 02/04/2019 0915   CO2 23 02/04/2019 0915   GLUCOSE 128 (H) 02/04/2019 0915   GLUCOSE 108 (H) 03/21/2010 1150   BUN 18 02/04/2019 0915   CREATININE 0.94 02/04/2019 0915   CALCIUM 10.1 02/04/2019 0915   GFRNONAA 68 02/04/2019 0915   GFRAA 78 02/04/2019 0915   Lab Results  Component Value Date   HGBA1C 6.2 (H) 02/04/2019   Lab Results  Component Value Date   INSULIN 14.9 02/04/2019   CBC    Component Value Date/Time   WBC 9.7 02/04/2019 0915   RBC 4.63 02/04/2019 0915   HGB 11.6 02/04/2019 0915   HCT 36.9 02/04/2019 0915   PLT 315 02/04/2019 0915   MCV 80 02/04/2019 0915   MCH 25.1 (L) 02/04/2019 0915   MCHC 31.4 (L) 02/04/2019 0915   RDW 14.3 02/04/2019  0915   LYMPHSABS 3.3 (H) 02/04/2019 0915   EOSABS 0.2 02/04/2019 0915   BASOSABS 0.0 02/04/2019 0915   Iron/TIBC/Ferritin/ %Sat No results found for: IRON, TIBC, FERRITIN, IRONPCTSAT Lipid Panel     Component Value Date/Time   CHOL 176 02/04/2019 0915   TRIG 73 02/04/2019 0915   HDL 56 02/04/2019 0915   LDLCALC 106 (H) 02/04/2019 0915   Hepatic Function Panel     Component Value Date/Time   PROT 7.7 02/04/2019 0915   ALBUMIN 4.2 02/04/2019 0915   AST 28 02/04/2019 0915   ALT 43 (H) 02/04/2019 0915   ALKPHOS 198 (H) 02/04/2019 0915   BILITOT 0.5 02/04/2019 0915      Component Value Date/Time   TSH 1.240 02/04/2019 0915   Results for VIVIEN, VILCHIS (MRN [id number]) as of 03/06/2019 09:05  Ref. Range 02/04/2019 09:15  Vitamin D, 25-Hydroxy Latest Ref Range: 30.0 - 100.0 ng/mL 39.4   OBESITY BEHAVIORAL INTERVENTION VISIT  Today's visit was #3  Starting weight: 243 lbs Starting date: 02/04/2019 Today's weight: 239 lbs Today's date: 03/06/2019 Total lbs lost to date: 4    03/06/2019  Height 5\' 2"  (1.575 m)  Weight 239 lb (108.4 kg)  BMI (Calculated) 43.7  BLOOD PRESSURE - SYSTOLIC [id number]  BLOOD PRESSURE - DIASTOLIC 73   Body Fat % 123456 %  Total Body Water (lbs) 80.6 lbs   ASK: We discussed the diagnosis of obesity with Leslie [name] today and Leslie [name] agreed to give [place] permission to discuss obesity behavioral modification therapy today.  ASSESS: Leslie [name] has the diagnosis of obesity and her BMI today is 43.7. Leslie [name] is in the action stage of change.   ADVISE: Leslie [name] was educated on the multiple health risks of obesity as well as the benefit of weight loss to improve her health. She was advised of the need for long term treatment and the importance of lifestyle modifications to improve her current health and to decrease her risk of future health problems.  AGREE: Multiple dietary modification options and treatment options were discussed and  Leslie [name] agreed to  follow the recommendations documented in the above note.  ARRANGE: Leslie [name] was educated on the  importance of frequent visits to treat obesity as outlined per CMS and USPSTF guidelines and agreed to schedule her next follow up appointment today.  I, Michaelene Song, am acting as Location manager for Dennard Nip, MD  I have reviewed the above documentation for accuracy and completeness, and I agree with the above. -Dennard Nip, MD

## 2019-03-11 MED FILL — SPIRONOLACTONE 25 MG TABS: 25 | 90 days supply | Qty: 90 | Fill #0

## 2019-03-12 MED FILL — ATENOLOL 50 MG TABLET: 50 | 30 days supply | Qty: 30 | Fill #2

## 2019-03-14 ENCOUNTER — Other Ambulatory Visit (INDEPENDENT_AMBULATORY_CARE_PROVIDER_SITE_OTHER): Payer: Self-pay | Admitting: Family Medicine

## 2019-03-14 DIAGNOSIS — R7303 Prediabetes: Secondary | ICD-10-CM

## 2019-03-17 MED FILL — ATORVASTATIN 10 MG TABLET: 10 | 90 days supply | Qty: 90 | Fill #0

## 2019-03-20 ENCOUNTER — Encounter (INDEPENDENT_AMBULATORY_CARE_PROVIDER_SITE_OTHER): Payer: Self-pay | Admitting: Family Medicine

## 2019-03-20 ENCOUNTER — Other Ambulatory Visit: Payer: Self-pay

## 2019-03-20 ENCOUNTER — Ambulatory Visit (INDEPENDENT_AMBULATORY_CARE_PROVIDER_SITE_OTHER): Payer: 59 | Admitting: Family Medicine

## 2019-03-20 VITALS — BP 130/76 | HR 67 | Temp 98.3°F | Ht 62.0 in | Wt 237.0 lb

## 2019-03-20 DIAGNOSIS — R7303 Prediabetes: Secondary | ICD-10-CM | POA: Diagnosis not present

## 2019-03-20 DIAGNOSIS — F3289 Other specified depressive episodes: Secondary | ICD-10-CM

## 2019-03-20 DIAGNOSIS — E559 Vitamin D deficiency, unspecified: Secondary | ICD-10-CM | POA: Diagnosis not present

## 2019-03-20 DIAGNOSIS — Z9189 Other specified personal risk factors, not elsewhere classified: Secondary | ICD-10-CM | POA: Diagnosis not present

## 2019-03-20 DIAGNOSIS — Z6841 Body Mass Index (BMI) 40.0 and over, adult: Secondary | ICD-10-CM | POA: Diagnosis not present

## 2019-03-20 MED ORDER — BUPROPION HCL ER (SR) 150 MG PO TB12: 150.0000 mg | ORAL_TABLET | ORAL | 0 refills | Status: DC

## 2019-03-24 NOTE — Progress Notes (Signed)
Office: [phone]  /  Fax: [phone]   HPI:   Chief Complaint: OBESITY Leslie [name] is here to discuss her progress with her obesity treatment plan. She is on the Category 2 plan and is following her eating plan approximately 70% of the time. She states she is walking 45-60 minutes 5 times per week. Leslie [name] likes Wellbutrin and reports the only side effect is insomnia if she takes the medication after 10 a.m. She admits to having a late breakfast if she has a 7 a.m. meeting and is running late. She reports metformin caused diarrhea, so she is trying to split the pill but it is difficult to do. She is now trying to crush the pill but is not sure if she is getting adequate doses.  Her weight is 237 lb (107.5 kg) today and has had a weight loss of 2 pounds over a period of 2 weeks since her last visit. She has lost 6 lbs since starting treatment with [place].  Pre-Diabetes Leslie [name] has a diagnosis of prediabetes based on her elevated Hgb A1c and was informed this puts her at greater risk of developing diabetes. Last A1c 6.2 on 02/04/2019. She is taking metformin, but is having trouble splitting the dose. She continues to work on diet and exercise to decrease risk of diabetes. She denies nausea or hypoglycemia.  At risk for diabetes Aneta is at higher than average risk for developing diabetes due to her obesity. She currently denies polyuria or polydipsia.  Depression with emotional eating behaviors Leslie [name] is struggling with emotional eating and using food for comfort to the extent that it is negatively impacting her health. She often snacks when she is not hungry. Leslie [name] sometimes feels she is out of control and then feels guilty that she made poor food choices. She has been working on behavior modification techniques to help reduce her emotional eating and has been somewhat successful. Wellbutrin SR 150 mg QAM was started at her last visit. She reports some insomnia if she takes with breakfast  after 10 a.m.  She shows no sign of suicidal or homicidal ideations.  Depression screen PHQ 2/9 02/04/2019  Decreased Interest 3  Down, Depressed, Hopeless 3  PHQ - 2 Score 6  Altered sleeping 3  Tired, decreased energy 3  Change in appetite 3  Feeling bad or failure about yourself  3  Trouble concentrating 2  Moving slowly or fidgety/restless 1  Suicidal thoughts 0  PHQ-9 Score 21  Difficult doing work/chores Not difficult at all   Vitamin D deficiency Leslie [name] has a diagnosis of Vitamin D deficiency. Last Vitamin D 39.4 on 02/04/2019. She is currently taking Vit D 2,000 IU and denies nausea, vomiting or muscle weakness.  ASSESSMENT AND PLAN:  Prediabetes  Other depression,with emotional eating - Plan: buPROPion (WELLBUTRIN SR) 150 MG 12 hr tablet  Vitamin D deficiency  At risk for diabetes mellitus  Class 3 severe obesity with serious comorbidity and body mass index (BMI) of 40.0 to 44.9 in adult, unspecified obesity type [facility])  PLAN:  Pre-Diabetes Leslie [name] will continue to work on weight loss, exercise, and decreasing simple carbohydrates in her diet to help decrease the risk of diabetes. We dicussed metformin including benefits and risks. She was informed that eating too many simple carbohydrates or too many calories at one sitting increases the likelihood of GI side effects. Florabelle was advised to get a pill cutter or ask the pharmacist to split the pills.  Diabetes risk counseling Leslie [name] was given extended (15  minutes) diabetes prevention counseling today. She is 58 y.o. female and has risk factors for diabetes including obesity. We discussed intensive lifestyle modifications today with an emphasis on weight loss as well as increasing exercise and decreasing simple carbohydrates in her diet.  Depression with Emotional Eating Behaviors We discussed behavior modification techniques today to help Ronnie deal with her emotional eating and depression. Amiylah was advised it  was okay to have a banana in the a.m. to help her take her a.m. medication.  Vitamin D Deficiency Leslie [name] was informed that low Vitamin D levels contributes to fatigue and are associated with obesity, breast, and colon cancer. She agrees to continue to take  Vit D @ 2,000 IU and will follow-up for routine testing of Vitamin D, at least 2-3 times per year. She was informed of the risk of over-replacement of Vitamin D and agrees to not increase her dose unless she discusses this with [place] first. Leslie [name] agrees to follow-up with our clinic in 2 weeks.  Obesity Leslie [name] is currently in the action stage of change. As such, her goal is to continue with weight loss efforts. She has agreed to follow the Category 3 plan. Leslie [name] has been instructed to exercise as tolerated for weight loss and overall health benefits. We discussed the following Behavioral Modification Strategies today: increasing lean protein intake, decreasing simple carbohydrates, increasing vegetables, increase H20 intake, and holiday eating strategies.  Leslie [name] has agreed to follow-up with our clinic in 2 weeks. She was informed of the importance of frequent follow-up visits to maximize her success with intensive lifestyle modifications for her multiple health conditions.  ALLERGIES: No Known Allergies  MEDICATIONS: Current Outpatient Medications on File Prior to Visit  Medication Sig Dispense Refill  . atenolol-chlorthalidone (TENORETIC) 50-25 MG per tablet Take 1 tablet by mouth daily.    [name] atorvastatin (LIPITOR) 10 MG tablet Take 10 mg by mouth daily.    . cetirizine (ZYRTEC) 10 MG tablet Take 10 mg by mouth as needed for allergies.    . Cholecalciferol (VITAMIN D) 50 MCG (2000 UT) CAPS Take 1 capsule by mouth daily.    [name] dexlansoprazole (DEXILANT) 60 MG capsule Take 60 mg by mouth daily.    . metFORMIN (GLUCOPHAGE) 500 MG tablet Take 1 tablet (500 mg total) by mouth daily with breakfast. 30 tablet 0  . naproxen sodium (ALEVE)  220 MG tablet Take 220 mg by mouth daily as needed.    [name] spironolactone (ALDACTONE) 50 MG tablet Take 50 mg by mouth daily.     No current facility-administered medications on file prior to visit.     PAST MEDICAL HISTORY: Past Medical History:  Diagnosis Date  . Allergy   . Anemia   . Arthritis   . Dyspnea   . GERD (gastroesophageal reflux disease)   . Hyperlipidemia   . Hypertension   . Joint pain   . Lower extremity edema   . Prediabetes   . Sickle cell trait ([place])     PAST SURGICAL HISTORY: Past Surgical History:  Procedure Laterality Date  . cyst on back     benign  . cyst on neck    . GANGLION CYST EXCISION     left hand  . WRIST SURGERY      SOCIAL HISTORY: Social History   Tobacco Use  . Smoking status: Never Smoker  . Smokeless tobacco: Never Used  Substance Use Topics  . Alcohol use: No    Comment: occ  . Drug use: No  FAMILY HISTORY: Family History  Problem Relation Age of Onset  . Breast cancer Mother   . Hypertension Mother   . Heart disease Mother   . Sleep apnea Mother    ROS: Review of Systems  Gastrointestinal: Negative for nausea and vomiting.  Musculoskeletal:       Negative for muscle weakness.  Endo/Heme/Allergies:       Negative for hypoglycemia.  Psychiatric/Behavioral: Positive for depression (emotional eating). Negative for suicidal ideas.       Negative for homicidal ideas.   PHYSICAL EXAM: Blood pressure 130/76, pulse 67, temperature 98.3 F (36.8 C), temperature source Oral, height 5\' 2"  (1.575 m), weight 237 lb (107.5 kg), SpO2 98 %. Body mass index is 43.35 kg/m. Physical Exam Vitals signs reviewed.  Constitutional:      Appearance: Normal appearance. She is obese.  Cardiovascular:     Rate and Rhythm: Normal rate.     Pulses: Normal pulses.  Pulmonary:     Effort: Pulmonary effort is normal.     Breath sounds: Normal breath sounds.  Musculoskeletal: Normal range of motion.  Skin:    General: Skin is warm  and dry.  Neurological:     Mental Status: She is alert and oriented to person, place, and time.  Psychiatric:        Behavior: Behavior normal.   RECENT LABS AND TESTS: BMET    Component Value Date/Time   NA 138 02/04/2019 0915   K 4.3 02/04/2019 0915   CL 102 02/04/2019 0915   CO2 23 02/04/2019 0915   GLUCOSE 128 (H) 02/04/2019 0915   GLUCOSE 108 (H) 03/21/2010 1150   BUN 18 02/04/2019 0915   CREATININE 0.94 02/04/2019 0915   CALCIUM 10.1 02/04/2019 0915   GFRNONAA 68 02/04/2019 0915   GFRAA 78 02/04/2019 0915   Lab Results  Component Value Date   HGBA1C 6.2 (H) 02/04/2019   Lab Results  Component Value Date   INSULIN 14.9 02/04/2019   CBC    Component Value Date/Time   WBC 9.7 02/04/2019 0915   RBC 4.63 02/04/2019 0915   HGB 11.6 02/04/2019 0915   HCT 36.9 02/04/2019 0915   PLT 315 02/04/2019 0915   MCV 80 02/04/2019 0915   MCH 25.1 (L) 02/04/2019 0915   MCHC 31.4 (L) 02/04/2019 0915   RDW 14.3 02/04/2019 0915   LYMPHSABS 3.3 (H) 02/04/2019 0915   EOSABS 0.2 02/04/2019 0915   BASOSABS 0.0 02/04/2019 0915   Iron/TIBC/Ferritin/ %Sat No results found for: IRON, TIBC, FERRITIN, IRONPCTSAT Lipid Panel     Component Value Date/Time   CHOL 176 02/04/2019 0915   TRIG 73 02/04/2019 0915   HDL 56 02/04/2019 0915   LDLCALC 106 (H) 02/04/2019 0915   Hepatic Function Panel     Component Value Date/Time   PROT 7.7 02/04/2019 0915   ALBUMIN 4.2 02/04/2019 0915   AST 28 02/04/2019 0915   ALT 43 (H) 02/04/2019 0915   ALKPHOS 198 (H) 02/04/2019 0915   BILITOT 0.5 02/04/2019 0915      Component Value Date/Time   TSH 1.240 02/04/2019 0915   Results for CORRA, BENCH (MRN [id number]) as of 03/24/2019 11:29  Ref. Range 02/04/2019 09:15  Vitamin D, 25-Hydroxy Latest Ref Range: 30.0 - 100.0 ng/mL 39.4   OBESITY BEHAVIORAL INTERVENTION VISIT  Today's visit was #4  Starting weight: 243 lbs Starting date: 02/04/2019 Today's weight: 237 lbs  Today's date:  03/20/2019 Total lbs lost to date: 6  03/20/2019  Height 5\' 2"  (1.575 m)  Weight 237 lb (107.5 kg)  BMI (Calculated) 43.34  BLOOD PRESSURE - SYSTOLIC [id number]  BLOOD PRESSURE - DIASTOLIC 76   Body Fat % [id number] %  Total Body Water (lbs) 83.6 lbs   ASK: We discussed the diagnosis of obesity with Jerene Dilling today and Aralynn agreed to give [place] permission to discuss obesity behavioral modification therapy today.  ASSESS: Lilygrace has the diagnosis of obesity and her BMI today is 43.5. Areyah is in the action stage of change.   ADVISE: Dhiya was educated on the multiple health risks of obesity as well as the benefit of weight loss to improve her health. She was advised of the need for long term treatment and the importance of lifestyle modifications to improve her current health and to decrease her risk of future health problems.  AGREE: Multiple dietary modification options and treatment options were discussed and  Dalene agreed to follow the recommendations documented in the above note.  ARRANGE: Arvilla was educated on the importance of frequent visits to treat obesity as outlined per CMS and USPSTF guidelines and agreed to schedule her next follow up appointment today.  IMichaelene Song, am acting as Location manager for [employer]. Juleen China, DO  I have reviewed the above documentation for accuracy and completeness, and I agree with the above. Briscoe Deutscher, DO

## 2019-03-29 ENCOUNTER — Other Ambulatory Visit (INDEPENDENT_AMBULATORY_CARE_PROVIDER_SITE_OTHER): Payer: Self-pay | Admitting: Family Medicine

## 2019-03-29 DIAGNOSIS — R7303 Prediabetes: Secondary | ICD-10-CM

## 2019-03-31 ENCOUNTER — Encounter (INDEPENDENT_AMBULATORY_CARE_PROVIDER_SITE_OTHER): Payer: Self-pay

## 2019-03-31 MED FILL — metFORMIN HCL 500 MG TABS: 500 | 30 days supply | Qty: 30 | Fill #0

## 2019-04-01 MED FILL — BUPROPION HCL SR 150 MG TAB: 150 | 30 days supply | Qty: 30 | Fill #0

## 2019-04-02 ENCOUNTER — Other Ambulatory Visit (INDEPENDENT_AMBULATORY_CARE_PROVIDER_SITE_OTHER): Payer: Self-pay | Admitting: Family Medicine

## 2019-04-02 DIAGNOSIS — F3289 Other specified depressive episodes: Secondary | ICD-10-CM

## 2019-04-02 DIAGNOSIS — M25572 Pain in left ankle and joints of left foot: Secondary | ICD-10-CM | POA: Diagnosis not present

## 2019-04-02 DIAGNOSIS — S8254XD Nondisplaced fracture of medial malleolus of right tibia, subsequent encounter for closed fracture with routine healing: Secondary | ICD-10-CM | POA: Diagnosis not present

## 2019-04-03 ENCOUNTER — Other Ambulatory Visit: Payer: Self-pay

## 2019-04-03 ENCOUNTER — Encounter (INDEPENDENT_AMBULATORY_CARE_PROVIDER_SITE_OTHER): Payer: Self-pay | Admitting: Family Medicine

## 2019-04-03 ENCOUNTER — Ambulatory Visit (INDEPENDENT_AMBULATORY_CARE_PROVIDER_SITE_OTHER): Payer: 59 | Admitting: Family Medicine

## 2019-04-03 VITALS — BP 115/75 | HR 65 | Temp 98.1°F | Ht 62.0 in | Wt 236.0 lb

## 2019-04-03 DIAGNOSIS — M25571 Pain in right ankle and joints of right foot: Secondary | ICD-10-CM

## 2019-04-03 DIAGNOSIS — Z6841 Body Mass Index (BMI) 40.0 and over, adult: Secondary | ICD-10-CM

## 2019-04-03 DIAGNOSIS — R7303 Prediabetes: Secondary | ICD-10-CM | POA: Diagnosis not present

## 2019-04-03 DIAGNOSIS — F3289 Other specified depressive episodes: Secondary | ICD-10-CM | POA: Diagnosis not present

## 2019-04-03 DIAGNOSIS — Z9189 Other specified personal risk factors, not elsewhere classified: Secondary | ICD-10-CM | POA: Diagnosis not present

## 2019-04-03 NOTE — Progress Notes (Signed)
Office: [phone]  /  Fax: [phone]   HPI:   Chief Complaint: OBESITY Leslie [name] is here to discuss her progress with her obesity treatment plan. She is on the Category 3 plan and is following her eating plan approximately 50 % of the time. She states she is exercising 0 minutes 0 times per week. [name] sprained her ankle on the day before Thanksgiving, and she has been in a long boot for 2 weeks.  Her weight is 236 lb (107 kg) today and has had a weight loss of 1 pound over a period of 2 weeks since her last visit. She has lost 7 lbs since starting treatment with [place].  Pre-Diabetes [name] has a diagnosis of pre-diabetes based on her elevated Hgb A1c and was informed this puts her at greater risk of developing diabetes. She is taking metformin currently and is now cutting her pill in half, taking 1/2 BID. she denies diarrhea. She continues to work on diet and exercise to decrease risk of diabetes. She denies nausea or hypoglycemia.  At risk for diabetes [name] is at higher than average risk for developing diabetes due to her obesity and pre-diabetes. She currently denies polyuria or polydipsia.  Right Ankle Pain [name] notes right ankle pain from sprained ankle. She will be healing for 6 weeks and going to rehab.   Depression with Emotional Eating Behaviors [name] is taking Wellbutrin earlier and denies insomnia. [name] struggles with emotional eating and using food for comfort to the extent that it is negatively impacting her health. She often snacks when she is not hungry. [name] sometimes feels she is out of control and then feels guilty that she made poor food choices. She has been working on behavior modification techniques to help reduce her emotional eating and has been somewhat successful. She shows no sign of suicidal or homicidal ideations.  Depression screen PHQ 2/9 02/04/2019  Decreased Interest 3  Down, Depressed, Hopeless 3  PHQ - 2 Score 6  Altered sleeping 3    Tired, decreased energy 3  Change in appetite 3  Feeling bad or failure about yourself  3  Trouble concentrating 2  Moving slowly or fidgety/restless 1  Suicidal thoughts 0  PHQ-9 Score 21  Difficult doing work/chores Not difficult at all    ASSESSMENT AND PLAN:  Prediabetes  Right ankle pain, unspecified chronicity  Other depression, emotional eating  At risk for diabetes mellitus  Class 3 severe obesity with serious comorbidity and body mass index (BMI) of 40.0 to 44.9 in adult, unspecified obesity type [facility])  PLAN:  Pre-Diabetes Day will continue to work on weight loss, exercise, and decreasing simple carbohydrates in her diet to help decrease the risk of diabetes. We dicussed metformin including benefits and risks. She was informed that eating too many simple carbohydrates or too many calories at one sitting increases the likelihood of GI side effects. [name] agrees to continue taking metformin, and she agrees to follow up with our clinic in 2 weeks as directed to monitor her progress.  Diabetes risk counseling [name] was given (~15 minutes) diabetes prevention counseling today. She is 58 y.o. female and has risk factors for diabetes including obesity and pre-diabetes. We discussed intensive lifestyle modifications today with an emphasis on weight loss as well as increasing exercise and decreasing simple carbohydrates in her diet.  Right Ankle Pain We discussed other ways to exercise. We reviewed habits that prevent her from mindless eating. (she states she reads books on Kindle and snacks on  yogurt with a little granola). We will continue to monitor.  Depression with Emotional Eating Behaviors We discussed behavior modification techniques today to help [name] deal with her emotional eating behaviors. [name] will continue her current treatment, and she agrees to follow up with our clinic in 2 weeks.  Obesity [name] is currently in the action stage of change. As  such, her goal is to continue with weight loss efforts She has agreed to follow the Category 3 plan [name] has been instructed to work up to a goal of 150 minutes of combined cardio and strengthening exercise per week or as tolerated for weight loss and overall health benefits. We discussed the following Behavioral Modification Strategies today: increasing lean protein intake, decreasing simple carbohydrates, increasing vegetables, increase H20 intake, better snacking choices, work on meal planning and easy cooking plans, holiday eating strategies  and decrease liquid calories   [name] has agreed to follow up with our clinic in 2 weeks. She was informed of the importance of frequent follow up visits to maximize her success with intensive lifestyle modifications for her multiple health conditions.  ALLERGIES: No Known Allergies  MEDICATIONS: Current Outpatient Medications on File Prior to Visit  Medication Sig Dispense Refill  . atenolol-chlorthalidone (TENORETIC) 50-25 MG per tablet Take 1 tablet by mouth daily.    [name] atorvastatin (LIPITOR) 10 MG tablet Take 10 mg by mouth daily.    [name] buPROPion (WELLBUTRIN SR) 150 MG 12 hr tablet Take 1 tablet (150 mg total) by mouth every morning. 30 tablet 0  . cetirizine (ZYRTEC) 10 MG tablet Take 10 mg by mouth as needed for allergies.    . Cholecalciferol (VITAMIN D) 50 MCG (2000 UT) CAPS Take 1 capsule by mouth daily.    [name] dexlansoprazole (DEXILANT) 60 MG capsule Take 60 mg by mouth daily.    . metFORMIN (GLUCOPHAGE) 500 MG tablet TAKE 1 TABLET BY MOUTH ONCE DAILY WITH BREAKFAST 30 tablet 0  . naproxen sodium (ALEVE) 220 MG tablet Take 220 mg by mouth daily as needed.    [name] spironolactone (ALDACTONE) 50 MG tablet Take 50 mg by mouth daily.     No current facility-administered medications on file prior to visit.     PAST MEDICAL HISTORY: Past Medical History:  Diagnosis Date  . Allergy   . Anemia   . Arthritis   . Dyspnea   . GERD  (gastroesophageal reflux disease)   . Hyperlipidemia   . Hypertension   . Joint pain   . Lower extremity edema   . Prediabetes   . Sickle cell trait ([place])     PAST SURGICAL HISTORY: Past Surgical History:  Procedure Laterality Date  . cyst on back     benign  . cyst on neck    . GANGLION CYST EXCISION     left hand  . WRIST SURGERY      SOCIAL HISTORY: Social History   Tobacco Use  . Smoking status: Never Smoker  . Smokeless tobacco: Never Used  Substance Use Topics  . Alcohol use: No    Comment: occ  . Drug use: No    FAMILY HISTORY: Family History  Problem Relation Age of Onset  . Breast cancer Mother   . Hypertension Mother   . Heart disease Mother   . Sleep apnea Mother     ROS: Review of Systems  Constitutional: Positive for weight loss.  Gastrointestinal: Negative for diarrhea and nausea.  Genitourinary: Negative for frequency.  Musculoskeletal:       +  Right Ankle pain  Endo/Heme/Allergies: Negative for polydipsia.       Negative hypoglycemia  Psychiatric/Behavioral: Positive for depression. Negative for suicidal ideas. The patient does not have insomnia.     PHYSICAL EXAM: Blood pressure 115/75, pulse 65, temperature 98.1 F (36.7 C), temperature source Oral, height 5\' 2"  (1.575 m), weight 236 lb (107 kg), SpO2 100 %. Body mass index is 43.16 kg/m. Physical Exam Vitals signs reviewed.  Constitutional:      Appearance: Normal appearance. She is obese.  Cardiovascular:     Rate and Rhythm: Normal rate.     Pulses: Normal pulses.  Pulmonary:     Effort: Pulmonary effort is normal.     Breath sounds: Normal breath sounds.  Musculoskeletal: Normal range of motion.  Skin:    General: Skin is warm and dry.  Neurological:     Mental Status: She is alert and oriented to person, place, and time.  Psychiatric:        Mood and Affect: Mood normal.        Behavior: Behavior normal.     RECENT LABS AND TESTS: BMET    Component Value  Date/Time   NA 138 02/04/2019 0915   K 4.3 02/04/2019 0915   CL 102 02/04/2019 0915   CO2 23 02/04/2019 0915   GLUCOSE 128 (H) 02/04/2019 0915   GLUCOSE 108 (H) 03/21/2010 1150   BUN 18 02/04/2019 0915   CREATININE 0.94 02/04/2019 0915   CALCIUM 10.1 02/04/2019 0915   GFRNONAA 68 02/04/2019 0915   GFRAA 78 02/04/2019 0915   Lab Results  Component Value Date   HGBA1C 6.2 (H) 02/04/2019   Lab Results  Component Value Date   INSULIN 14.9 02/04/2019   CBC    Component Value Date/Time   WBC 9.7 02/04/2019 0915   RBC 4.63 02/04/2019 0915   HGB 11.6 02/04/2019 0915   HCT 36.9 02/04/2019 0915   PLT 315 02/04/2019 0915   MCV 80 02/04/2019 0915   MCH 25.1 (L) 02/04/2019 0915   MCHC 31.4 (L) 02/04/2019 0915   RDW 14.3 02/04/2019 0915   LYMPHSABS 3.3 (H) 02/04/2019 0915   EOSABS 0.2 02/04/2019 0915   BASOSABS 0.0 02/04/2019 0915   Iron/TIBC/Ferritin/ %Sat No results found for: IRON, TIBC, FERRITIN, IRONPCTSAT Lipid Panel     Component Value Date/Time   CHOL 176 02/04/2019 0915   TRIG 73 02/04/2019 0915   HDL 56 02/04/2019 0915   LDLCALC 106 (H) 02/04/2019 0915   Hepatic Function Panel     Component Value Date/Time   PROT 7.7 02/04/2019 0915   ALBUMIN 4.2 02/04/2019 0915   AST 28 02/04/2019 0915   ALT 43 (H) 02/04/2019 0915   ALKPHOS 198 (H) 02/04/2019 0915   BILITOT 0.5 02/04/2019 0915      Component Value Date/Time   TSH 1.240 02/04/2019 0915      OBESITY BEHAVIORAL INTERVENTION VISIT  Today's visit was # 5   Starting weight: 243 lbs Starting date: 02/04/2019 Today's weight : 236 lbs Today's date: 04/03/2019 Total lbs lost to date: 7    ASK: We discussed the diagnosis of obesity with Jerene Dilling today and Mac agreed to give [place] permission to discuss obesity behavioral modification therapy today.  ASSESS: Keiran has the diagnosis of obesity and her BMI today is 43.15 Eveny is in the action stage of change   ADVISE: Nolana was educated  on the multiple health risks of obesity as well as the benefit of weight loss  to improve her health. She was advised of the need for long term treatment and the importance of lifestyle modifications to improve her current health and to decrease her risk of future health problems.  AGREE: Multiple dietary modification options and treatment options were discussed and  Mishay agreed to follow the recommendations documented in the above note.  ARRANGE: Alonda was educated on the importance of frequent visits to treat obesity as outlined per CMS and USPSTF guidelines and agreed to schedule her next follow up appointment today.  Wilhemena Durie, am acting as transcriptionist for Briscoe Deutscher, DO  I have reviewed the above documentation for accuracy and completeness, and I agree with the above. Briscoe Deutscher, DO

## 2019-04-07 ENCOUNTER — Encounter (INDEPENDENT_AMBULATORY_CARE_PROVIDER_SITE_OTHER): Payer: Self-pay | Admitting: Family Medicine

## 2019-04-10 ENCOUNTER — Encounter (INDEPENDENT_AMBULATORY_CARE_PROVIDER_SITE_OTHER): Payer: Self-pay | Admitting: Bariatrics

## 2019-04-10 DIAGNOSIS — Z6841 Body Mass Index (BMI) 40.0 and over, adult: Secondary | ICD-10-CM | POA: Insufficient documentation

## 2019-04-11 MED FILL — ATENOLOL 50 MG TABLET: 50 | 30 days supply | Qty: 30 | Fill #3

## 2019-04-14 ENCOUNTER — Ambulatory Visit (INDEPENDENT_AMBULATORY_CARE_PROVIDER_SITE_OTHER): Payer: 59 | Admitting: Bariatrics

## 2019-04-14 ENCOUNTER — Other Ambulatory Visit: Payer: Self-pay

## 2019-04-14 ENCOUNTER — Encounter (INDEPENDENT_AMBULATORY_CARE_PROVIDER_SITE_OTHER): Payer: Self-pay | Admitting: Bariatrics

## 2019-04-14 VITALS — BP 123/76 | HR 81 | Temp 98.3°F | Ht 62.0 in | Wt 234.0 lb

## 2019-04-14 DIAGNOSIS — Z6841 Body Mass Index (BMI) 40.0 and over, adult: Secondary | ICD-10-CM | POA: Diagnosis not present

## 2019-04-14 DIAGNOSIS — R7303 Prediabetes: Secondary | ICD-10-CM | POA: Diagnosis not present

## 2019-04-14 DIAGNOSIS — E7849 Other hyperlipidemia: Secondary | ICD-10-CM

## 2019-04-14 NOTE — Progress Notes (Signed)
Office: [phone]  /  Fax: [phone]   HPI:  Chief Complaint: OBESITY Leslie [name] is here to discuss her progress with her obesity treatment plan. She is on the Category 3 plan and states she is following her eating plan approximately 45% of the time. She states she is exercising 0 minutes 0 times per week.  Leslie [name] is down 2 lbs for the holiday (Thanksgiving holiday). She is trying to stay on the plan but has not always had all of the food on the plan. She is not eating sugar now.  Today's visit was #6  Starting weight: 243 lbs Starting date: 02/04/2019 Today's weight: 234 lbs  Today's date: 04/14/2019 Total lbs lost to date: 9 Total lbs lost since last in-office visit: 2  Hyperlipidemia (Stable) Leslie [name] has a diagnosis of hyperlipidemia, which is stable, and she is taking Lipitor. Last LDL 106 on 02/04/2019.  Prediabetes (Stable) Leslie [name] has a diagnosis of prediabetes. She is taking metformin and reports some GI upset. Last A1c 6.2 on 02/04/2019 with an insulin of 14.9.  ASSESSMENT AND PLAN:  Other hyperlipidemia  Prediabetes  Class 3 severe obesity with serious comorbidity and body mass index (BMI) of 40.0 to 44.9 in adult, unspecified obesity type ([place])  PLAN:  Hyperlipidemia (Stable) Intensive lifestyle modifications as the first line treatment for hyperlipidemia. We discussed many lifestyle modifications today and Fumiko will continue medications, cut out trans fats, limits saturated fats, increase protein, increase MUFA's and PUFA's, and work on diet, exercise and weight loss efforts.  Pre-Diabetes (Stable)  Lannah will continue to work on weight loss, exercise, and decreasing simple carbohydrates to help decrease the risk of diabetes. She will continue metformin taking 1/2 tablet at a.m. and signs/symptoms improving.  TIME SPENT: 20 minutes  Obesity Leslie [name] is currently in the action stage of change. As such, her goal is to continue with weight loss  efforts. She has agreed to follow the Category 3 plan. Leslie [name] will work on meal planning and increasing protein (keep high protein foods at home). She can occasionally have 1/2 cup of rice. Leslie [name] has been instructed to resume her exercises after removing the ankle boot for weight loss and overall health benefits. She has follow-up next week. We discussed the following Behavioral Modification Strategies today: increasing lean protein intake, decreasing simple carbohydrates, increasing vegetables, increase H20 intake, decrease eating out, no skipping meals, work on meal planning and easy cooking plans, and planning for success.  Leslie [name] has agreed to follow-up with our clinic in 2-3 weeks. She was informed of the importance of frequent follow-up visits to maximize her success with intensive lifestyle modifications for her multiple health conditions.  ALLERGIES: No Known Allergies  MEDICATIONS: Current Outpatient Medications on File Prior to Visit  Medication Sig Dispense Refill  . atenolol-chlorthalidone (TENORETIC) 50-25 MG per tablet Take 1 tablet by mouth daily.    [name] atorvastatin (LIPITOR) 10 MG tablet Take 10 mg by mouth daily.    [name] buPROPion (WELLBUTRIN SR) 150 MG 12 hr tablet Take 1 tablet (150 mg total) by mouth every morning. 30 tablet 0  . cetirizine (ZYRTEC) 10 MG tablet Take 10 mg by mouth as needed for allergies.    . Cholecalciferol (VITAMIN D) 50 MCG (2000 UT) CAPS Take 1 capsule by mouth daily.    [name] dexlansoprazole (DEXILANT) 60 MG capsule Take 60 mg by mouth daily.    . metFORMIN (GLUCOPHAGE) 500 MG tablet TAKE 1 TABLET BY MOUTH ONCE DAILY WITH BREAKFAST 30 tablet 0  .  naproxen sodium (ALEVE) 220 MG tablet Take 220 mg by mouth daily as needed.    [name] spironolactone (ALDACTONE) 50 MG tablet Take 50 mg by mouth daily.     No current facility-administered medications on file prior to visit.    PAST MEDICAL HISTORY: Past Medical History:  Diagnosis Date  . Allergy   . Anemia    . Arthritis   . Dyspnea   . GERD (gastroesophageal reflux disease)   . Hyperlipidemia   . Hypertension   . Joint pain   . Lower extremity edema   . Prediabetes   . Sickle cell trait ([place])     PAST SURGICAL HISTORY: Past Surgical History:  Procedure Laterality Date  . cyst on back     benign  . cyst on neck    . GANGLION CYST EXCISION     left hand  . WRIST SURGERY      SOCIAL HISTORY: Social History   Tobacco Use  . Smoking status: Never Smoker  . Smokeless tobacco: Never Used  Substance Use Topics  . Alcohol use: No    Comment: occ  . Drug use: No    FAMILY HISTORY: Family History  Problem Relation Age of Onset  . Breast cancer Mother   . Hypertension Mother   . Heart disease Mother   . Sleep apnea Mother    ROS: Review of Systems  Gastrointestinal:       Positive for some GI upset with metformin.   PHYSICAL EXAM: Blood pressure 123/76, pulse 81, temperature 98.3 F (36.8 C), temperature source Oral, height 5\' 2"  (1.575 m), weight 234 lb (106.1 kg), SpO2 97 %. Body mass index is 42.8 kg/m. Physical Exam Vitals reviewed.  Constitutional:      Appearance: Normal appearance. She is obese.  Cardiovascular:     Rate and Rhythm: Normal rate.     Pulses: Normal pulses.  Pulmonary:     Effort: Pulmonary effort is normal.     Breath sounds: Normal breath sounds.  Musculoskeletal:        General: Normal range of motion.  Skin:    General: Skin is warm and dry.  Neurological:     Mental Status: She is alert and oriented to person, place, and time.  Psychiatric:        Behavior: Behavior normal.   RECENT LABS AND TESTS: BMET    Component Value Date/Time   NA 138 02/04/2019 0915   K 4.3 02/04/2019 0915   CL 102 02/04/2019 0915   CO2 23 02/04/2019 0915   GLUCOSE 128 (H) 02/04/2019 0915   GLUCOSE 108 (H) 03/21/2010 1150   BUN 18 02/04/2019 0915   CREATININE 0.94 02/04/2019 0915   CALCIUM 10.1 02/04/2019 0915   GFRNONAA 68 02/04/2019 0915    GFRAA 78 02/04/2019 0915   Lab Results  Component Value Date   HGBA1C 6.2 (H) 02/04/2019   Lab Results  Component Value Date   INSULIN 14.9 02/04/2019   CBC    Component Value Date/Time   WBC 9.7 02/04/2019 0915   RBC 4.63 02/04/2019 0915   HGB 11.6 02/04/2019 0915   HCT 36.9 02/04/2019 0915   PLT 315 02/04/2019 0915   MCV 80 02/04/2019 0915   MCH 25.1 (L) 02/04/2019 0915   MCHC 31.4 (L) 02/04/2019 0915   RDW 14.3 02/04/2019 0915   LYMPHSABS 3.3 (H) 02/04/2019 0915   EOSABS 0.2 02/04/2019 0915   BASOSABS 0.0 02/04/2019 0915   Iron/TIBC/Ferritin/ %Sat No results found  for: IRON, TIBC, FERRITIN, IRONPCTSAT Lipid Panel     Component Value Date/Time   CHOL 176 02/04/2019 0915   TRIG 73 02/04/2019 0915   HDL 56 02/04/2019 0915   LDLCALC 106 (H) 02/04/2019 0915   Hepatic Function Panel     Component Value Date/Time   PROT 7.7 02/04/2019 0915   ALBUMIN 4.2 02/04/2019 0915   AST 28 02/04/2019 0915   ALT 43 (H) 02/04/2019 0915   ALKPHOS 198 (H) 02/04/2019 0915   BILITOT 0.5 02/04/2019 0915      Component Value Date/Time   TSH 1.240 02/04/2019 0915    OBESITY BEHAVIORAL INTERVENTION VISIT DOCUMENTATION FOR INSURANCE (~15 minutes)  I, Michaelene Song, am acting as Location manager for [employer], DO

## 2019-04-24 ENCOUNTER — Other Ambulatory Visit (INDEPENDENT_AMBULATORY_CARE_PROVIDER_SITE_OTHER): Payer: Self-pay | Admitting: Family Medicine

## 2019-04-24 DIAGNOSIS — R7303 Prediabetes: Secondary | ICD-10-CM

## 2019-04-24 MED FILL — metFORMIN HCL 500 MG TABS: 500 | 30 days supply | Qty: 30 | Fill #0

## 2019-04-28 ENCOUNTER — Other Ambulatory Visit (INDEPENDENT_AMBULATORY_CARE_PROVIDER_SITE_OTHER): Payer: Self-pay | Admitting: Family Medicine

## 2019-04-28 DIAGNOSIS — F3289 Other specified depressive episodes: Secondary | ICD-10-CM

## 2019-05-01 MED FILL — VITAMIN D3 2,000 UNIT TAB: 50 MCG | 100 days supply | Qty: 100 | Fill #0

## 2019-05-08 ENCOUNTER — Ambulatory Visit (INDEPENDENT_AMBULATORY_CARE_PROVIDER_SITE_OTHER): Payer: 59 | Admitting: Family Medicine

## 2019-05-08 ENCOUNTER — Other Ambulatory Visit: Payer: Self-pay

## 2019-05-08 ENCOUNTER — Other Ambulatory Visit (INDEPENDENT_AMBULATORY_CARE_PROVIDER_SITE_OTHER): Payer: Self-pay | Admitting: Family Medicine

## 2019-05-08 VITALS — BP 147/83 | HR 81 | Temp 98.0°F | Ht 62.0 in | Wt 235.0 lb

## 2019-05-08 DIAGNOSIS — F3289 Other specified depressive episodes: Secondary | ICD-10-CM

## 2019-05-08 DIAGNOSIS — Z6841 Body Mass Index (BMI) 40.0 and over, adult: Secondary | ICD-10-CM

## 2019-05-08 DIAGNOSIS — I10 Essential (primary) hypertension: Secondary | ICD-10-CM | POA: Diagnosis not present

## 2019-05-12 MED FILL — ATENOLOL 50 MG TABLET: 50 | 90 days supply | Qty: 90 | Fill #0

## 2019-05-13 NOTE — Progress Notes (Signed)
Chief Complaint:   OBESITY Leslie [name] is here to discuss her progress with her obesity treatment plan along with follow-up of her obesity related diagnoses. Leslie [name] is on the Category 3 Plan and states she is following her eating plan approximately 30-40% of the time. Leslie [name] states she is doing 0 minutes 0 times per week.  Today's visit was #: 7 Starting weight: 243 lbs Starting date: 02/04/2019 Today's weight: 235 lbs Today's date: 05/08/2019 Total lbs lost to date: 8 Total lbs lost since last in-office visit: 0  Interim History: Leslie [name] did some celebration eating over the holidays. She is deviating from her plan more, and it appears she may not be meeting her protein goals.  Subjective:   1. Essential hypertension Leslie [name]'s blood pressure is elevated today, normally well controlled with medications and diet but she forgot to take her medications today. She denies chest pain.   Assessment/Plan:   1. Essential hypertension Leslie [name] will continue working on healthy weight loss, diet, and exercise to improve blood pressure control. We will watch for signs of hypotension as she continues her lifestyle modifications. Leslie [name] agrees to continue her medications and we will recheck her blood pressure in 2 weeks.  2. Class 3 severe obesity with serious comorbidity and body mass index (BMI) of 40.0 to 44.9 in adult, unspecified obesity type ([facility]) Leslie [name] is currently in the action stage of change. As such, her goal is to continue with weight loss efforts. She has agreed to on the Category 2 Plan and keeping a food journal and adhering to recommended goals of 400-600 calories and 40 grams of protein at supper daily.   We discussed the following exercise goals today: For substantial health benefits, adults should do at least 150 minutes (2 hours and 30 minutes) a week of moderate-intensity, or 75 minutes (1 hour and 15 minutes) a week of vigorous-intensity aerobic physical activity, or  an equivalent combination of moderate- and vigorous-intensity aerobic activity. Aerobic activity should be performed in episodes of at least 10 minutes, and preferably, it should be spread throughout the week. Adults should also include muscle-strengthening activities that involve all major muscle groups on 2 or more days a week.  We discussed the following behavioral modification strategies today: meal planning and cooking strategies and keeping a strict food journal.  Leslie [name] has agreed to follow-up with our clinic in 2 weeks. She was informed of the importance of frequent follow-up visits to maximize her success with intensive lifestyle modifications for her multiple health conditions.   Objective:   Blood pressure (!) 147/83, pulse 81, temperature 98 F (36.7 C), temperature source Oral, height 5\' 2"  (1.575 m), weight 235 lb (106.6 kg), SpO2 98 %. Body mass index is 42.98 kg/m.  General: Cooperative, alert, well developed, in no acute distress. HEENT: Conjunctivae and lids unremarkable. Neck: No thyromegaly.  Cardiovascular: Regular rhythm.  Lungs: Normal work of breathing. Extremities: No edema.  Neurologic: No focal deficits.   Lab Results  Component Value Date   CREATININE 0.94 02/04/2019   BUN 18 02/04/2019   NA 138 02/04/2019   K 4.3 02/04/2019   CL 102 02/04/2019   CO2 23 02/04/2019   Lab Results  Component Value Date   ALT 43 (H) 02/04/2019   AST 28 02/04/2019   ALKPHOS 198 (H) 02/04/2019   BILITOT 0.5 02/04/2019   Lab Results  Component Value Date   HGBA1C 6.2 (H) 02/04/2019   Lab Results  Component Value Date  INSULIN 14.9 02/04/2019   Lab Results  Component Value Date   TSH 1.240 02/04/2019   Lab Results  Component Value Date   CHOL 176 02/04/2019   HDL 56 02/04/2019   LDLCALC 106 (H) 02/04/2019   TRIG 73 02/04/2019   Lab Results  Component Value Date   WBC 9.7 02/04/2019   HGB 11.6 02/04/2019   HCT 36.9 02/04/2019   MCV 80  02/04/2019   PLT 315 02/04/2019   No results found for: IRON, TIBC, FERRITIN  Attestation Statements:   Reviewed by clinician on day of visit: allergies, medications, problem list, medical history, surgical history, family history, social history, and previous encounter notes.  Time spent on visit including pre-visit chart review and post-visit care was 20 minutes.   I, Trixie Dredge, am acting as transcriptionist for Dennard Nip, MD.  I have reviewed the above documentation for accuracy and completeness, and I agree with the above. -  Dennard Nip, MD

## 2019-05-15 ENCOUNTER — Ambulatory Visit (INDEPENDENT_AMBULATORY_CARE_PROVIDER_SITE_OTHER): Payer: 59 | Admitting: Family Medicine

## 2019-05-19 ENCOUNTER — Other Ambulatory Visit (INDEPENDENT_AMBULATORY_CARE_PROVIDER_SITE_OTHER): Payer: Self-pay | Admitting: Family Medicine

## 2019-05-19 DIAGNOSIS — R7303 Prediabetes: Secondary | ICD-10-CM

## 2019-05-22 ENCOUNTER — Telehealth (INDEPENDENT_AMBULATORY_CARE_PROVIDER_SITE_OTHER): Payer: 59 | Admitting: Family Medicine

## 2019-05-22 ENCOUNTER — Encounter (INDEPENDENT_AMBULATORY_CARE_PROVIDER_SITE_OTHER): Payer: Self-pay | Admitting: Family Medicine

## 2019-05-22 ENCOUNTER — Other Ambulatory Visit: Payer: Self-pay

## 2019-05-22 DIAGNOSIS — R7303 Prediabetes: Secondary | ICD-10-CM | POA: Diagnosis not present

## 2019-05-22 DIAGNOSIS — F3289 Other specified depressive episodes: Secondary | ICD-10-CM

## 2019-05-22 DIAGNOSIS — F32A Depression, unspecified: Secondary | ICD-10-CM | POA: Insufficient documentation

## 2019-05-22 DIAGNOSIS — Z6841 Body Mass Index (BMI) 40.0 and over, adult: Secondary | ICD-10-CM | POA: Diagnosis not present

## 2019-05-22 DIAGNOSIS — F329 Major depressive disorder, single episode, unspecified: Secondary | ICD-10-CM | POA: Insufficient documentation

## 2019-05-22 NOTE — Progress Notes (Signed)
TeleHealth Visit:  Due to the COVID-19 pandemic, this visit was completed with telemedicine (audio/video) technology to reduce patient and provider exposure as well as to preserve personal protective equipment.   [name] has verbally consented to this TeleHealth visit. The patient is located at home, the provider is located at the [facility] office. The participants in this visit include the listed provider and patient. The visit was conducted today via Webex.  Chief Complaint: OBESITY Leslie [name] is here to discuss her progress with her obesity treatment plan along with follow-up of her obesity related diagnoses. Leslie [name] is on the Category 2 Plan and states she is following her eating plan approximately 60% of the time. Leslie [name] states she is climbing stairs 30 minutes 6 times per week.  Today's visit was #: 8 Starting weight: 243 lbs Starting date: 02/04/2019  Interim History: Leslie [name] admits to not following the plan as she should. 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 She p[acks her lunch daily but does not eat it sometimes.   Subjective:   Prediabetes. Leslie [name] has a diagnosis of prediabetes based on her elevated HgA1c. She continues to work on diet and exercise to decrease her risk of diabetes. She denies nausea but does report diarrhea. No polyphagia. Leslie [name] is on metformin QAM.  Lab Results  Component Value Date   HGBA1C 6.2 (H) 02/04/2019   Lab Results  Component Value Date   INSULIN 14.9 02/04/2019   Other depression, emotional eating.  Leslie [name] is struggling with emotional eating and using food for comfort to the extent that it is negatively impacting her health. She has been working on behavior modification techniques to help reduce her emotional eating and has been somewhat successful. She shows no sign of suicidal or homicidal ideations. Leslie [name] reports  stress eating is well controlled. She feels the bupropion is helping with emotional eating.  Assessment/Plan:   Prediabetes. Leslie [name] will continue the meal plan and metformin (1/2 pill in the a.m.), work on weight loss, exercise, and decreasing simple carbohydrates to help decrease the risk of diabetes.   Other depression, emotional eating. Behavior modification techniques were discussed today to help Leslie [name] deal with her emotional/non-hunger eating behaviors.  Orders and follow up as documented in patient record. She will continue bupropion as prescribed.  Class 3 severe obesity with serious comorbidity and body mass index (BMI) of 40.0 to 44.9 in adult, unspecified obesity type ([place]).  Leslie [name] is currently in the action stage of change. As such, her goal is to continue with weight loss efforts. She has agreed to the Category 2 Plan.   Additional breakfast and lunch options were given (handouts sent via MyChart). We discussed eating a packed lunch versus eating lunch brought in by sales reps.  Exercise goals: Leslie [name] will continue her current exercise regimen.  Behavioral modification strategies: increasing lean protein intake, decreasing simple carbohydrates, meal planning and cooking strategies and planning for success.  Leslie [name] has agreed to follow-up with our clinic in 2 weeks. She was informed of the importance of frequent follow-up visits to maximize her success with intensive lifestyle modifications for her multiple health conditions.  Objective:   VITALS: Per patient if applicable, see vitals. GENERAL: Alert and in no acute distress. CARDIOPULMONARY: No increased WOB. Speaking in clear sentences.  PSYCH: Pleasant and cooperative. Speech normal rate and rhythm. Affect is appropriate. Insight and judgement are  appropriate. Attention is focused, linear, and appropriate.  NEURO: Oriented as arrived to appointment on time with no prompting.   Lab Results  Component Value Date    CREATININE 0.94 02/04/2019   BUN 18 02/04/2019   NA 138 02/04/2019   K 4.3 02/04/2019   CL 102 02/04/2019   CO2 23 02/04/2019   Lab Results  Component Value Date   ALT 43 (H) 02/04/2019   AST 28 02/04/2019   ALKPHOS 198 (H) 02/04/2019   BILITOT 0.5 02/04/2019   Lab Results  Component Value Date   HGBA1C 6.2 (H) 02/04/2019   Lab Results  Component Value Date   INSULIN 14.9 02/04/2019   Lab Results  Component Value Date   TSH 1.240 02/04/2019   Lab Results  Component Value Date   CHOL 176 02/04/2019   HDL 56 02/04/2019   LDLCALC 106 (H) 02/04/2019   TRIG 73 02/04/2019   Lab Results  Component Value Date   WBC 9.7 02/04/2019   HGB 11.6 02/04/2019   HCT 36.9 02/04/2019   MCV 80 02/04/2019   PLT 315 02/04/2019   No results found for: IRON, TIBC, FERRITIN  Attestation Statements:   Reviewed by clinician on day of visit: allergies, medications, problem list, medical history, surgical history, family history, social history, and previous encounter notes.  IMichaelene Song, am acting as Location manager for [employer], FNP   I have reviewed the above documentation for accuracy and completeness, and I agree with the above. - Georgianne Fick, FNP

## 2019-05-29 ENCOUNTER — Other Ambulatory Visit (INDEPENDENT_AMBULATORY_CARE_PROVIDER_SITE_OTHER): Payer: Self-pay

## 2019-05-29 DIAGNOSIS — F3289 Other specified depressive episodes: Secondary | ICD-10-CM

## 2019-05-29 MED ORDER — BUPROPION HCL ER (SR) 150 MG PO TB12: 150.0000 mg | ORAL_TABLET | ORAL | 0 refills | Status: DC

## 2019-05-29 MED FILL — BUPROPION HCL SR 150 MG TAB: 150 | 14 days supply | Qty: 14 | Fill #0

## 2019-05-30 ENCOUNTER — Other Ambulatory Visit (INDEPENDENT_AMBULATORY_CARE_PROVIDER_SITE_OTHER): Payer: Self-pay | Admitting: Family Medicine

## 2019-05-30 DIAGNOSIS — R7303 Prediabetes: Secondary | ICD-10-CM

## 2019-06-05 ENCOUNTER — Ambulatory Visit (INDEPENDENT_AMBULATORY_CARE_PROVIDER_SITE_OTHER): Payer: 59 | Admitting: Family Medicine

## 2019-06-05 ENCOUNTER — Encounter (INDEPENDENT_AMBULATORY_CARE_PROVIDER_SITE_OTHER): Payer: Self-pay | Admitting: Family Medicine

## 2019-06-05 ENCOUNTER — Other Ambulatory Visit: Payer: Self-pay

## 2019-06-05 VITALS — BP 120/70 | HR 75 | Temp 98.2°F | Ht 62.0 in | Wt 232.0 lb

## 2019-06-05 DIAGNOSIS — E7849 Other hyperlipidemia: Secondary | ICD-10-CM

## 2019-06-05 DIAGNOSIS — Z9189 Other specified personal risk factors, not elsewhere classified: Secondary | ICD-10-CM | POA: Diagnosis not present

## 2019-06-05 DIAGNOSIS — Z6841 Body Mass Index (BMI) 40.0 and over, adult: Secondary | ICD-10-CM

## 2019-06-05 DIAGNOSIS — R7303 Prediabetes: Secondary | ICD-10-CM | POA: Diagnosis not present

## 2019-06-05 DIAGNOSIS — E559 Vitamin D deficiency, unspecified: Secondary | ICD-10-CM | POA: Diagnosis not present

## 2019-06-05 NOTE — Progress Notes (Signed)
Chief Complaint:   Leslie [name] is here to discuss her progress with her Leslie treatment plan along with follow-up of her Leslie related diagnoses. Leslie [name] is on the Category 2 Plan and states she is following her eating plan approximately 45% of the time. Leslie [name] states she is stair climbing 25 to 30 minutes 6 times per week.  Today's visit was #: 9 Starting weight: 243 lbs Starting date: 02/04/2019 Today's weight: 232 lbs Today's date: 06/05/2019 Total lbs lost to date: 11 Total lbs lost since last in-office visit: 3  Interim History: Leslie [name] is working on making better [employer]. She is still eating too much rice (more than 1/2 cup per day). She reports eating less sweets than she used to.  Subjective:   Prediabetes Leslie [name] is tolerating metformin better.  She denies polyphagia. Her last A1c was 6.2 (02/04/19). Leslie [name] has been able to take 500 mg of metformin rather than 250 mg the last few days. Labs were discussed with patient today.  Lab Results  Component Value Date   HGBA1C 6.2 (H) 02/04/2019   Lab Results  Component Value Date   INSULIN 14.9 02/04/2019   Vitamin D deficiency  Leslie [name]'s Vitamin D level was 39.4 on 02/04/19 and is not at goal. She is on OTC vit D 2,000 IU daily. .  Other hyperlipidemia  Leslie [name] has hyperlipidemia and she is on Lipitor. Her LDL is slightly elevated at 106 (02/04/19). Her triglycerides and HDL are at goal. She has been trying to improve her cholesterol levels with intensive lifestyle modification including a low saturated fat diet, exercise and weight loss. She denies any chest pain.  Lab Results  Component Value Date   ALT 43 (H) 02/04/2019   AST 28 02/04/2019   ALKPHOS 198 (H) 02/04/2019   BILITOT 0.5 02/04/2019   Lab Results  Component Value Date   CHOL 176 02/04/2019   HDL 56 02/04/2019   LDLCALC 106 (H) 02/04/2019   TRIG 73 02/04/2019   At risk for diabetes mellitus Leslie [name] is at higher than average  risk for developing diabetes due to her Leslie and prediabetes.   Assessment/Plan:   Prediabetes Leslie [name] will continue to work on weight loss, exercise, and decreasing simple carbohydrates to help decrease the risk of diabetes. We will check A1c, fasting insulin and glucose today.  Vitamin D deficiency Low Vitamin D level contributes to fatigue and are associated with Leslie, breast, and colon cancer. Leslie [name] will continue to take OTC vitamin D and we will check vitamin D level today. Leslie [name] will follow-up for routine testing of Vitamin D, at least 2-3 times per year to avoid over-replacement.  Other hyperlipidemia  Cardiovascular risk and specific lipid/LDL goals reviewed.  We discussed several lifestyle modifications today and Leslie [name] will continue to work on diet, exercise and weight loss efforts. We will check fasting lipid panel today. Orders and follow up as documented in patient record.    At risk for diabetes mellitus Leslie [name] was given approximately 15 minutes of diabetes education and counseling today. We discussed intensive lifestyle modifications today with an emphasis on weight loss as well as increasing exercise and decreasing simple carbohydrates in her diet. We also reviewed medication options with an emphasis on risk versus benefit of those discussed.   Repetitive spaced learning was employed today to elicit superior memory formation and behavioral change.  Class 3 severe Leslie with serious comorbidity and body mass index (BMI) of 40.0 to 44.9 in adult, unspecified Leslie type (  Leslie [name]) Leslie [name] is currently in the action stage of change. As such, her goal is to continue with weight loss efforts. She has agreed to the Category 2 Plan and keeping a food journal and adhering to recommended goals of 300 to 400 calories and 30+ grams of protein at lunch daily.   Exercise goals: Leslie [name] will continue her current exercise regimen.  Behavioral modification strategies: decreasing  simple carbohydrates, meal planning and cooking strategies and planning for success. Leslie [name] will limit rice to 1/2 cup daily.  Leslie [name] has agreed to follow-up with our clinic in 2 weeks. She was informed of the importance of frequent follow-up visits to maximize her success with intensive lifestyle modifications for her multiple health conditions.   Leslie [name] was informed we would discuss her lab results at her next visit unless there is a critical issue that needs to be addressed sooner. Leslie [name] agreed to keep her next visit at the agreed upon time to discuss these results.  Objective:   Blood pressure 120/70, pulse 75, temperature 98.2 F (36.8 C), temperature source Oral, height 5\' 2"  (1.575 m), weight 232 lb (105.2 kg), SpO2 100 %. Body mass index is 42.43 kg/m.  General: Cooperative, alert, well developed, in no acute distress. HEENT: Conjunctivae and lids unremarkable. Cardiovascular: Regular rhythm.  Lungs: Normal work of breathing. Neurologic: No focal deficits.   Lab Results  Component Value Date   CREATININE 0.94 02/04/2019   BUN 18 02/04/2019   NA 138 02/04/2019   K 4.3 02/04/2019   CL 102 02/04/2019   CO2 23 02/04/2019   Lab Results  Component Value Date   ALT 43 (H) 02/04/2019   AST 28 02/04/2019   ALKPHOS 198 (H) 02/04/2019   BILITOT 0.5 02/04/2019   Lab Results  Component Value Date   HGBA1C 6.2 (H) 02/04/2019   Lab Results  Component Value Date   INSULIN 14.9 02/04/2019   Lab Results  Component Value Date   TSH 1.240 02/04/2019   Lab Results  Component Value Date   CHOL 176 02/04/2019   HDL 56 02/04/2019   LDLCALC 106 (H) 02/04/2019   TRIG 73 02/04/2019   Lab Results  Component Value Date   WBC 9.7 02/04/2019   HGB 11.6 02/04/2019   HCT 36.9 02/04/2019   MCV 80 02/04/2019   PLT 315 02/04/2019   No results found for: IRON, TIBC, FERRITIN   Ref. Range 02/04/2019 09:15  Vitamin D, 25-Hydroxy Latest Ref Range: 30.0 - 100.0 ng/mL 39.4     Attestation Statements:   Reviewed by clinician on day of visit: allergies, medications, problem list, medical history, surgical history, family history, social history, and previous encounter notes.  Corey Skains, am acting as Location manager for [employer], FNP-C.  I have reviewed the above documentation for accuracy and completeness, and I agree with the above. -  Raphaela Cannaday [employer], FNP-C

## 2019-06-06 LAB — COMPREHENSIVE METABOLIC PANEL
ALT: 49 IU/L — ABNORMAL HIGH (ref 0–32)
AST: 37 IU/L (ref 0–40)
Albumin/Globulin Ratio: 1.3 (ref 1.2–2.2)
Albumin: 4.4 g/dL (ref 3.8–4.9)
Alkaline Phosphatase: 186 IU/L — ABNORMAL HIGH (ref 39–117)
BUN/Creatinine Ratio: 14 (ref 9–23)
BUN: 17 mg/dL (ref 6–24)
Bilirubin Total: 0.5 mg/dL (ref 0.0–1.2)
CO2: 19 mmol/L — ABNORMAL LOW (ref 20–29)
Calcium: 9.7 mg/dL (ref 8.7–10.2)
Chloride: 105 mmol/L (ref 96–106)
Creatinine, Ser: 1.2 mg/dL — ABNORMAL HIGH (ref 0.57–1.00)
GFR calc Af Amer: 58 mL/min/{1.73_m2} — ABNORMAL LOW (ref >59)
GFR calc non Af Amer: 51 mL/min/{1.73_m2} — ABNORMAL LOW (ref >59)
Globulin, Total: 3.5 g/dL (ref 1.5–4.5)
Glucose: 149 mg/dL — ABNORMAL HIGH (ref 65–99)
Potassium: 4.5 mmol/L (ref 3.5–5.2)
Sodium: 139 mmol/L (ref 134–144)
Total Protein: 7.9 g/dL (ref 6.0–8.5)

## 2019-06-06 LAB — LIPID PANEL WITH LDL/HDL RATIO
Cholesterol, Total: 167 mg/dL (ref 100–199)
HDL: 47 mg/dL (ref >39)
LDL Chol Calc (NIH): 106 mg/dL — ABNORMAL HIGH (ref 0–99)
LDL/HDL Ratio: 2.3 ratio (ref 0.0–3.2)
Triglycerides: 73 mg/dL (ref 0–149)
VLDL Cholesterol Cal: 14 mg/dL (ref 5–40)

## 2019-06-06 LAB — HEMOGLOBIN A1C
Est. average glucose Bld gHb Est-mCnc: 126 mg/dL
Hgb A1c MFr Bld: 6 % — ABNORMAL HIGH (ref 4.8–5.6)

## 2019-06-06 LAB — VITAMIN D 25 HYDROXY (VIT D DEFICIENCY, FRACTURES): Vit D, 25-Hydroxy: 36.5 ng/mL (ref 30.0–100.0)

## 2019-06-06 LAB — INSULIN, RANDOM: INSULIN: 20.1 u[IU]/mL (ref 2.6–24.9)

## 2019-06-09 MED FILL — ATORVASTATIN 10 MG TABLET: 10 | 90 days supply | Qty: 90 | Fill #1

## 2019-06-17 ENCOUNTER — Other Ambulatory Visit (INDEPENDENT_AMBULATORY_CARE_PROVIDER_SITE_OTHER): Payer: Self-pay | Admitting: Family Medicine

## 2019-06-17 DIAGNOSIS — F3289 Other specified depressive episodes: Secondary | ICD-10-CM

## 2019-06-17 MED FILL — NYSTATIN-TRIAMCINOLONE CRM: 100000-0.1 | 10 days supply | Qty: 30 | Fill #2

## 2019-06-18 ENCOUNTER — Encounter (INDEPENDENT_AMBULATORY_CARE_PROVIDER_SITE_OTHER): Payer: Self-pay | Admitting: Family Medicine

## 2019-06-18 ENCOUNTER — Encounter (INDEPENDENT_AMBULATORY_CARE_PROVIDER_SITE_OTHER): Payer: Self-pay

## 2019-06-18 ENCOUNTER — Other Ambulatory Visit: Payer: Self-pay

## 2019-06-18 ENCOUNTER — Telehealth (INDEPENDENT_AMBULATORY_CARE_PROVIDER_SITE_OTHER): Payer: 59 | Admitting: Family Medicine

## 2019-06-18 DIAGNOSIS — R944 Abnormal results of kidney function studies: Secondary | ICD-10-CM | POA: Diagnosis not present

## 2019-06-18 DIAGNOSIS — E559 Vitamin D deficiency, unspecified: Secondary | ICD-10-CM | POA: Diagnosis not present

## 2019-06-18 DIAGNOSIS — F3289 Other specified depressive episodes: Secondary | ICD-10-CM

## 2019-06-18 DIAGNOSIS — K760 Fatty (change of) liver, not elsewhere classified: Secondary | ICD-10-CM | POA: Diagnosis not present

## 2019-06-18 DIAGNOSIS — Z6841 Body Mass Index (BMI) 40.0 and over, adult: Secondary | ICD-10-CM

## 2019-06-18 DIAGNOSIS — E66813 Obesity, class 3: Secondary | ICD-10-CM

## 2019-06-19 ENCOUNTER — Telehealth (INDEPENDENT_AMBULATORY_CARE_PROVIDER_SITE_OTHER): Payer: 59 | Admitting: Family Medicine

## 2019-06-22 MED ORDER — VITAMIN D (ERGOCALCIFEROL) 1.25 MG (50000 UNIT) PO CAPS: 50000.0000 [IU] | ORAL_CAPSULE | ORAL | 0 refills | Status: DC

## 2019-06-22 MED ORDER — BUPROPION HCL ER (SR) 150 MG PO TB12: 150.0000 mg | ORAL_TABLET | ORAL | 0 refills | Status: DC

## 2019-06-23 DIAGNOSIS — K760 Fatty (change of) liver, not elsewhere classified: Secondary | ICD-10-CM | POA: Insufficient documentation

## 2019-06-23 MED FILL — BUPROPION HCL SR 150 MG TAB: 150 | 30 days supply | Qty: 30 | Fill #0

## 2019-06-23 MED FILL — VIT D2 1.25 MG (50,000 UNIT: 1.25 MG | 28 days supply | Qty: 4 | Fill #0

## 2019-06-23 NOTE — Progress Notes (Signed)
TeleHealth Visit:  Due to the COVID-19 pandemic, this visit was completed with telemedicine (audio/video) technology to reduce patient and provider exposure as well as to preserve personal protective equipment.   Leslie [name] has verbally consented to this TeleHealth visit. The patient is located at work, the provider is located at the [facility] office. The participants in this visit include the listed provider and patient. The visit was conducted today via telephone call (15 minutes).  Leslie [name] was unable to use realtime audiovisual technology today and the telehealth visit was conducted via telephone.  Chief Complaint: OBESITY Leslie [name] is here to discuss her progress with her obesity treatment plan along with follow-up of her obesity related diagnoses. Leslie [name] is on the Category 2 Plan and states she is following her eating plan approximately 75% of the time. Leslie [name] states she is stair climbing 30-45 minutes 4-5 times per week.  Today's visit was #: 10 Starting weight: 243 lbs Starting date: 02/04/2019  Interim History: Leslie [name] reports she has lost 2 lbs. She reports her hunger is well satisfied. She is eating all of the food on the plan mostly. She sometimes does not eat the bread at breakfast.   Subjective:   Vitamin D deficiency. Vitamin D is decreasing on 2000 OTC Vitamin D daily. Level is not at goal with last Vitamin D level 36.5 on 06/05/2019.  Other depression,with emotional eating. Leslie [name] is struggling with emotional eating and using food for comfort to the extent that it is negatively impacting her health. She has been working on behavior modification techniques to help reduce her emotional eating and has been somewhat successful. She shows no sign of suicidal or homicidal ideations. Mood is stable.  NAFLD (nonalcoholic fatty liver disease). Liver enzymes are fairly stable. AST normal at 37; ALT elevated at 49; alkaline phosphatase elevated at  186. Abdominal MRI 03/10/18 showed hepatic steatosis.  Decreased GFR. Leslie [name]'s GFR is low at 58, down from 78.  Assessment/Plan:   Vitamin D deficiency. Low Vitamin D level contributes to fatigue and are associated with obesity, breast, and colon cancer. She was given a new prescription for Vitamin D, Ergocalciferol, (DRISDOL) 1.25 MG (50000 UNIT) CAPS capsule every week #12 with 0 refills (patient requests 90-day supply) and will follow-up for routine testing of Vitamin D, at least 2-3 times per year to avoid over-replacement.     Other depression,with emotional eating. Behavior modification techniques were discussed today to help Leslie [name] deal with her emotional/non-hunger eating behaviors.  Orders and follow up as documented in patient record. Leslie [name] was given a refill on her buPROPion (WELLBUTRIN SR) 150 MG 12 hr tablet QAM #90 with 0 refills (patient requests 90-day supply).  NAFLD (nonalcoholic fatty liver disease). We discussed that weight loss is the best treatment for fatty liver disease.  Decreased GFR. Will continue to monitor.  Class 3 severe obesity with serious comorbidity and body mass index (BMI) of 40.0 to 44.9 in adult, unspecified obesity type (Leslie [name]).  Leslie [name] is currently in the action stage of change. As such, her goal is to continue with weight loss efforts. She has agreed to the Category 2 Plan.   She may leave off bread at breakfast. She may substitute 1/4 cup of rice for the bread if she desires.  Exercise goals: Leslie [name] will continue her current exercise regimen.  Behavioral modification strategies: decreasing simple carbohydrates, meal planning and cooking strategies and planning for success.  Leslie [name] has agreed to follow-up with our clinic in 3 weeks.  She was informed of the importance of frequent follow-up visits to maximize her success with intensive lifestyle modifications for her multiple health conditions.  Objective:   VITALS: Per patient if applicable,  see vitals. GENERAL: Alert and in no acute distress. CARDIOPULMONARY: No increased WOB. Speaking in clear sentences.  PSYCH: Pleasant and cooperative. Speech normal rate and rhythm. Affect is appropriate. Insight and judgement are appropriate. Attention is focused, linear, and appropriate.  NEURO: Oriented as arrived to appointment on time with no prompting.   Lab Results  Component Value Date   CREATININE 1.20 (H) 06/05/2019   BUN 17 06/05/2019   NA 139 06/05/2019   K 4.5 06/05/2019   CL 105 06/05/2019   CO2 19 (L) 06/05/2019   Lab Results  Component Value Date   ALT 49 (H) 06/05/2019   AST 37 06/05/2019   ALKPHOS 186 (H) 06/05/2019   BILITOT 0.5 06/05/2019   Lab Results  Component Value Date   HGBA1C 6.0 (H) 06/05/2019   HGBA1C 6.2 (H) 02/04/2019   Lab Results  Component Value Date   INSULIN 20.1 06/05/2019   INSULIN 14.9 02/04/2019   Lab Results  Component Value Date   TSH 1.240 02/04/2019   Lab Results  Component Value Date   CHOL 167 06/05/2019   HDL 47 06/05/2019   LDLCALC 106 (H) 06/05/2019   TRIG 73 06/05/2019   Lab Results  Component Value Date   WBC 9.7 02/04/2019   HGB 11.6 02/04/2019   HCT 36.9 02/04/2019   MCV 80 02/04/2019   PLT 315 02/04/2019   No results found for: IRON, TIBC, FERRITIN  Attestation Statements:   Reviewed by clinician on day of visit: allergies, medications, problem list, medical history, surgical history, family history, social history, and previous encounter notes.  IMichaelene Song, am acting as Location manager for [employer], FNP   I have reviewed the above documentation for accuracy and completeness, and I agree with the above. - Georgianne Fick, FNP

## 2019-06-25 MED FILL — SPIRONOLACTONE 25 MG TABS: 25 | 90 days supply | Qty: 90 | Fill #1

## 2019-07-01 ENCOUNTER — Telehealth (INDEPENDENT_AMBULATORY_CARE_PROVIDER_SITE_OTHER): Payer: 59 | Admitting: Family Medicine

## 2019-07-07 ENCOUNTER — Telehealth: Payer: Self-pay | Admitting: Family

## 2019-07-07 DIAGNOSIS — Z7251 High risk heterosexual behavior: Secondary | ICD-10-CM

## 2019-07-07 MED ORDER — LEVONORGESTREL 1.5 MG PO TABS: 1.5000 mg | ORAL_TABLET | Freq: Once | ORAL | 0 refills | Status: AC

## 2019-07-07 MED ORDER — LEVONORGESTREL 1.5 MG PO TABS: 1.5000 mg | ORAL_TABLET | Freq: Once | ORAL | Status: DC

## 2019-07-07 NOTE — Telephone Encounter (Signed)
Levonorgesterl  1.5 mg tablet, one dose, # 1 tablet with no RF's.RX for above e-scribed and sent to pharmacy on record  [id number] [address], [place] [address] Phone: [phone] Fax: [phone]

## 2019-07-09 ENCOUNTER — Encounter (INDEPENDENT_AMBULATORY_CARE_PROVIDER_SITE_OTHER): Payer: Self-pay | Admitting: Family Medicine

## 2019-07-09 ENCOUNTER — Other Ambulatory Visit: Payer: Self-pay

## 2019-07-09 ENCOUNTER — Ambulatory Visit (INDEPENDENT_AMBULATORY_CARE_PROVIDER_SITE_OTHER): Payer: 59 | Admitting: Family Medicine

## 2019-07-09 VITALS — BP 115/74 | HR 58 | Temp 98.2°F | Ht 62.0 in | Wt 234.0 lb

## 2019-07-09 DIAGNOSIS — Z6841 Body Mass Index (BMI) 40.0 and over, adult: Secondary | ICD-10-CM | POA: Diagnosis not present

## 2019-07-09 DIAGNOSIS — J309 Allergic rhinitis, unspecified: Secondary | ICD-10-CM

## 2019-07-09 DIAGNOSIS — E559 Vitamin D deficiency, unspecified: Secondary | ICD-10-CM

## 2019-07-09 MED ORDER — CETIRIZINE HCL 10 MG PO TABS: 10.0000 mg | ORAL_TABLET | ORAL | 0 refills | Status: AC | PRN

## 2019-07-09 MED ORDER — VITAMIN D (ERGOCALCIFEROL) 1.25 MG (50000 UNIT) PO CAPS: 50000.0000 [IU] | ORAL_CAPSULE | ORAL | 0 refills | Status: AC

## 2019-07-09 MED ORDER — FLUTICASONE PROPIONATE 50 MCG/ACT NA SUSP: 2.0000 | Freq: Every day | NASAL | 6 refills | Status: AC

## 2019-07-09 MED FILL — FLUTICASONE PROP 50 MCG SPR: 50 | 30 days supply | Qty: 16 | Fill #0

## 2019-07-09 NOTE — Progress Notes (Signed)
Chief Complaint:   OBESITY Leslie [name] is here to discuss her progress with her obesity treatment plan along with follow-up of her obesity related diagnoses. Leslie [name] is on the Category 2 Plan and states she is following her eating plan approximately 60% of the time. Leslie [name] states she is walking for 20 minutes 5 times per week.  Today's visit was #: 11 Starting weight: 243 lbs Starting date: 02/04/2019 Today's weight: 234 lbs Today's date: 07/09/2019 Total lbs lost to date: 9 Total lbs lost since last in-office visit: 0  Interim History: Leslie [name] is getting a bit bored with her plan, and she would like to make some changes for lunch. Her hunger is controlled, and she is retaining some water weight after eating comfort food this weekend.  Subjective:   1. Vitamin D deficiency Leslie [name] is stable on Vit D, and she requests a refill today.  2. Allergic rhinitis Leslie [name] is taking Zyrtec year around and she needs a refill of Zyrtec. She still notes some symptoms even while on medications.  Assessment/Plan:   1. Vitamin D deficiency Low Vitamin D level contributes to fatigue and are associated with obesity, breast, and colon cancer. We will refill prescription Vitamin D for 1 month. Leslie [name] will follow-up for routine testing of Vitamin D, at least 2-3 times per year to avoid over-replacement.  - Vitamin D, Ergocalciferol, (DRISDOL) 1.25 MG (50000 UNIT) CAPS capsule; Take 1 capsule (50,000 Units total) by mouth every 7 (seven) days.  Dispense: 4 capsule; Refill: 0  2. Allergic rhinitis We will refill Zyrtec 10 mg daily for 1 month, and Leslie [name] agreed to start Flonase 2 sprays in each nostril, with no refills. Will continue to monitor.  - cetirizine (ZYRTEC) 10 MG tablet; Take 1 tablet (10 mg total) by mouth as needed for allergies.  Dispense: 30 tablet; Refill: 0 - fluticasone (FLONASE) 50 MCG/ACT nasal spray; Place 2 sprays into both nostrils daily.  Dispense: 16 g; Refill: 6  3. Class 3  severe obesity with serious comorbidity and body mass index (BMI) of 40.0 to 44.9 in adult, unspecified obesity type ([facility]) Leslie [name] is currently in the action stage of change. As such, her goal is to continue with weight loss efforts. She has agreed to the Category 2 Plan with lunch options.   Exercise goals: As is.  Behavioral modification strategies: increasing lean protein intake and meal planning and cooking strategies.  Leslie [name] has agreed to follow-up with our clinic in 3 weeks with [facility], FNP-C. She was informed of the importance of frequent follow-up visits to maximize her success with intensive lifestyle modifications for her multiple health conditions.   Objective:   Blood pressure 115/74, pulse (!) 58, temperature 98.2 F (36.8 C), temperature source Oral, height 5\' 2"  (1.575 m), weight 234 lb (106.1 kg), SpO2 98 %. Body mass index is 42.8 kg/m.  General: Cooperative, alert, well developed, in no acute distress. HEENT: Conjunctivae and lids unremarkable. Cardiovascular: Regular rhythm.  Lungs: Normal work of breathing. Neurologic: No focal deficits.   Lab Results  Component Value Date   CREATININE 1.20 (H) 06/05/2019   BUN 17 06/05/2019   NA 139 06/05/2019   K 4.5 06/05/2019   CL 105 06/05/2019   CO2 19 (L) 06/05/2019   Lab Results  Component Value Date   ALT 49 (H) 06/05/2019   AST 37 06/05/2019   ALKPHOS 186 (H) 06/05/2019   BILITOT 0.5 06/05/2019   Lab Results  Component Value Date   HGBA1C 6.0 (  H) 06/05/2019   HGBA1C 6.2 (H) 02/04/2019   Lab Results  Component Value Date   INSULIN 20.1 06/05/2019   INSULIN 14.9 02/04/2019   Lab Results  Component Value Date   TSH 1.240 02/04/2019   Lab Results  Component Value Date   CHOL 167 06/05/2019   HDL 47 06/05/2019   LDLCALC 106 (H) 06/05/2019   TRIG 73 06/05/2019   Lab Results  Component Value Date   WBC 9.7 02/04/2019   HGB 11.6 02/04/2019   HCT 36.9 02/04/2019   MCV 80 02/04/2019   PLT  315 02/04/2019   No results found for: IRON, TIBC, FERRITIN  Attestation Statements:   Reviewed by clinician on day of visit: allergies, medications, problem list, medical history, surgical history, family history, social history, and previous encounter notes.   I, Trixie Dredge, am acting as transcriptionist for Dennard Nip, MD.  I have reviewed the above documentation for accuracy and completeness, and I agree with the above. - Dennard Nip, MD

## 2019-07-15 MED FILL — VIT D2 1.25 MG (50,000 UNIT: 1.25 MG | 28 days supply | Qty: 4 | Fill #0

## 2019-07-17 ENCOUNTER — Other Ambulatory Visit (INDEPENDENT_AMBULATORY_CARE_PROVIDER_SITE_OTHER): Payer: Self-pay | Admitting: Family Medicine

## 2019-07-17 DIAGNOSIS — F3289 Other specified depressive episodes: Secondary | ICD-10-CM

## 2019-07-22 DIAGNOSIS — N924 Excessive bleeding in the premenopausal period: Secondary | ICD-10-CM | POA: Diagnosis not present

## 2019-07-22 DIAGNOSIS — D252 Subserosal leiomyoma of uterus: Secondary | ICD-10-CM | POA: Diagnosis not present

## 2019-07-22 DIAGNOSIS — D251 Intramural leiomyoma of uterus: Secondary | ICD-10-CM | POA: Diagnosis not present

## 2019-07-22 MED FILL — MEGESTROL 40 MG TABLET: 40 | 30 days supply | Qty: 30 | Fill #0

## 2019-07-31 ENCOUNTER — Ambulatory Visit (INDEPENDENT_AMBULATORY_CARE_PROVIDER_SITE_OTHER): Payer: 59 | Admitting: Family Medicine

## 2019-08-02 MED FILL — FLUTICASONE PROP 50 MCG SPR: 50 | 30 days supply | Qty: 16 | Fill #1

## 2019-08-04 MED FILL — ATENOLOL 50 MG TABLET: 50 | 90 days supply | Qty: 90 | Fill #1

## 2019-08-04 MED FILL — VITAMIN D3 2,000 UNIT TAB: 50 MCG | 100 days supply | Qty: 100 | Fill #1

## 2019-08-06 ENCOUNTER — Other Ambulatory Visit (INDEPENDENT_AMBULATORY_CARE_PROVIDER_SITE_OTHER): Payer: Self-pay | Admitting: Family Medicine

## 2019-08-06 DIAGNOSIS — E559 Vitamin D deficiency, unspecified: Secondary | ICD-10-CM

## 2019-08-08 DIAGNOSIS — N939 Abnormal uterine and vaginal bleeding, unspecified: Secondary | ICD-10-CM | POA: Diagnosis not present

## 2019-08-08 DIAGNOSIS — D259 Leiomyoma of uterus, unspecified: Secondary | ICD-10-CM | POA: Diagnosis not present

## 2019-08-08 DIAGNOSIS — N950 Postmenopausal bleeding: Secondary | ICD-10-CM | POA: Diagnosis not present

## 2019-08-18 MED FILL — MEGESTROL 40 MG TABLET: 40 | 30 days supply | Qty: 30 | Fill #0

## 2019-08-20 MED FILL — VITAMIN D3 50,000 UNITS CAP: 1.25 MG | 28 days supply | Qty: 4 | Fill #0

## 2019-08-29 MED FILL — PHENAZOPYRIDINE 200 MG TAB: 200 | 1 days supply | Qty: 1 | Fill #0

## 2019-09-01 MED FILL — FLUTICASONE PROP 50 MCG SPR: 50 | 30 days supply | Qty: 16 | Fill #2

## 2019-09-04 ENCOUNTER — Other Ambulatory Visit: Payer: Self-pay | Admitting: Obstetrics and Gynecology

## 2019-09-10 NOTE — Patient Instructions (Addendum)
DUE TO COVID-19 ONLY ONE VISITOR IS ALLOWED IN WAITING ROOM (VISITOR WILL HAVE A TEMPERATURE CHECK ON ARRIVAL AND MUST WEAR A FACE MASK THE ENTIRE TIME.)  ONCE YOU ARE ADMITTED TO YOUR PRIVATE ROOM, THE SAME ONE VISITOR IS ALLOWED TO VISIT DURING VISITING HOURS ONLY.  Your COVID swab testing is scheduled for: 09/15/19  at 1:00 pm , You must self quarantine after your testing per handout given to you at the testing site.  ([facility] testing enter pre-surgical testing line)   Your procedure is scheduled on: 09/18/19  Report to [facility] AT: 8:00  A. M.   Call this number if you have problems the morning of [phone].   OUR ADDRESS IS [address]  WE ARE LOCATED IN THE [place]                                   [facility].                                     REMEMBER:   DO NOT EAT SOLID FOOD AFTER MIDNIGHT .  CLEAR LIQUID DIET FROM MIDNIGHT UNTIL: 7:00 am     CLEAR LIQUID DIET   Foods Allowed                                                                     Foods Excluded  Coffee and tea, regular and decaf                             liquids that you cannot  Plain Jell-O any favor except red or purple                                           see through such as: Fruit ices (not with fruit pulp)                                     milk, soups, orange juice  Iced Popsicles                                    All solid food Carbonated beverages, regular and diet                                    Cranberry, grape and apple juices Sports drinks like Gatorade Lightly seasoned clear broth or consume(fat free) Sugar, honey syrup  Sample Menu Breakfast                                Lunch  Supper Cranberry juice                    Beef broth                            Chicken broth Jell-O                                     Grape juice                            Apple juice Coffee or tea                        Jell-O                                      Popsicle                                                Coffee or tea                        Coffee or tea  _____________________________________________________________________  BRUSH YOUR TEETH THE MORNING OF SURGERY.  TAKE THESE MEDICATIONS MORNING OF SURGERY WITH A SIP OF WATER:  Cetirizine.Dexlansoprazole as needed. Flonase as usual.  DO NOT WEAR JEWERLY, MAKE UP, OR NAIL POLISH.  DO NOT WEAR LOTIONS, POWDERS, PERFUMES/COLOGNE OR DEODORANT.  DO NOT SHAVE FOR 24 HOURS PRIOR TO DAY OF SURGERY.  MEN MAY SHAVE FACE AND NECK.  CONTACTS, GLASSES, OR DENTURES MAY NOT BE WORN TO SURGERY.                                    [facility] IS NOT RESPONSIBLE  FOR ANY BELONGINGS.          BRING ALL PRESCRIPTION MEDICATIONS WITH YOU THE DAY OF SURGERY IN ORIGINAL CONTAINERS                               YOU MAY BRING A SMALL OVERNIGHT BAG                                   .[facility] - Preparing for Surgery Before surgery, you can play an important role.  Because skin is not sterile, your skin needs to be as free of germs as possible.  You can reduce the number of germs on your skin by washing with CHG (chlorahexidine gluconate) soap before surgery.  CHG is an antiseptic cleaner which kills germs and bonds with the skin to continue killing germs even after washing. Please DO NOT use if you have an allergy to CHG or antibacterial soaps.  If your skin becomes reddened/irritated stop using the CHG and inform your nurse when you arrive at Short Stay. Do not shave (including legs and underarms) for at least 48 hours prior to the first CHG  shower.  You may shave your face/neck. Please follow these instructions carefully:  1.  Shower with CHG Soap the night before surgery and the  morning of Surgery.  2.  If you choose to wash your hair, wash your hair first as usual with your  normal  shampoo.  3.  After  you shampoo, rinse your hair and body thoroughly to remove the  shampoo.                           4.  Use CHG as you would any other liquid soap.  You can apply chg directly  to the skin and wash                       Gently with a scrungie or clean washcloth.  5.  Apply the CHG Soap to your body ONLY FROM THE NECK DOWN.   Do not use on face/ open                           Wound or open sores. Avoid contact with eyes, ears mouth and genitals (private parts).                       Wash face,  Genitals (private parts) with your normal soap.             6.  Wash thoroughly, paying special attention to the area where your surgery  will be performed.  7.  Thoroughly rinse your body with warm water from the neck down.  8.  DO NOT shower/wash with your normal soap after using and rinsing off  the CHG Soap.                9.  Pat yourself dry with a clean towel.            10.  Wear clean pajamas.            11.  Place clean sheets on your bed the night of your first shower and do not  sleep with pets. Day of Surgery : Do not apply any lotions/deodorants the morning of surgery.  Please wear clean clothes to the hospital/surgery center.  FAILURE TO FOLLOW THESE INSTRUCTIONS MAY RESULT IN THE CANCELLATION OF YOUR SURGERY PATIENT SIGNATURE_________________________________  NURSE SIGNATURE__________________________________  ________________________________________________________________________

## 2019-09-11 ENCOUNTER — Other Ambulatory Visit: Payer: Self-pay

## 2019-09-11 ENCOUNTER — Encounter (HOSPITAL_COMMUNITY)
Admission: RE | Admit: 2019-09-11 | Discharge: 2019-09-11 | Disposition: A | Discharge status: Home / Self Care | Payer: 59 | Source: Ambulatory Visit | Attending: Obstetrics and Gynecology | Admitting: Obstetrics and Gynecology

## 2019-09-11 ENCOUNTER — Encounter (HOSPITAL_COMMUNITY): Payer: Self-pay

## 2019-09-11 ENCOUNTER — Other Ambulatory Visit (HOSPITAL_COMMUNITY): Payer: 59

## 2019-09-11 DIAGNOSIS — Z01812 Encounter for preprocedural laboratory examination: Secondary | ICD-10-CM | POA: Diagnosis not present

## 2019-09-11 DIAGNOSIS — N760 Acute vaginitis: Secondary | ICD-10-CM | POA: Diagnosis not present

## 2019-09-11 HISTORY — DX: Cardiac murmur, unspecified: R01.1

## 2019-09-11 HISTORY — DX: Prediabetes: R73.03

## 2019-09-11 NOTE — Progress Notes (Signed)
PCP - Dr. Gaynelle Arabian. LOV: 12/2018 Cardiologist -   Chest x-ray -  EKG - 02/04/19. EPIC Stress Test -  ECHO -  Cardiac Cath -  A1C: 06/05/19: 6.0 Sleep Study -  CPAP -   Fasting Blood Sugar -  Checks Blood Sugar _____ times a day  Blood Thinner Instructions: Aspirin Instructions: Last Dose:  Anesthesia review:   Patient denies shortness of breath, fever, cough and chest pain at PAT appointment   Patient verbalized understanding of instructions that were given to them at the PAT appointment. Patient was also instructed that they will need to review over the PAT instructions again at home before surgery.

## 2019-09-12 ENCOUNTER — Encounter (HOSPITAL_COMMUNITY)
Admission: RE | Admit: 2019-09-12 | Discharge: 2019-09-12 | Disposition: A | Discharge status: Home / Self Care | Payer: 59 | Source: Ambulatory Visit | Attending: Obstetrics and Gynecology | Admitting: Obstetrics and Gynecology

## 2019-09-12 DIAGNOSIS — Z01812 Encounter for preprocedural laboratory examination: Secondary | ICD-10-CM | POA: Diagnosis not present

## 2019-09-12 LAB — BASIC METABOLIC PANEL
Anion gap: 9 (ref 5–15)
BUN: 20 mg/dL (ref 6–20)
CO2: 21 mmol/L — ABNORMAL LOW (ref 22–32)
Calcium: 9.5 mg/dL (ref 8.9–10.3)
Chloride: 105 mmol/L (ref 98–111)
Creatinine, Ser: 0.97 mg/dL (ref 0.44–1.00)
GFR calc Af Amer: >60 mL/min (ref >60)
GFR calc non Af Amer: >60 mL/min (ref >60)
Glucose, Bld: 111 mg/dL — ABNORMAL HIGH (ref 70–99)
Potassium: 4.4 mmol/L (ref 3.5–5.1)
Sodium: 135 mmol/L (ref 135–145)

## 2019-09-12 LAB — CBC
HCT: 34.7 % — ABNORMAL LOW (ref 36.0–46.0)
Hemoglobin: 10.9 g/dL — ABNORMAL LOW (ref 12.0–15.0)
MCH: 24.9 pg — ABNORMAL LOW (ref 26.0–34.0)
MCHC: 31.4 g/dL (ref 30.0–36.0)
MCV: 79.4 fL — ABNORMAL LOW (ref 80.0–100.0)
Platelets: 342 10*3/uL (ref 150–400)
RBC: 4.37 MIL/uL (ref 3.87–5.11)
RDW: 14.3 % (ref 11.5–15.5)
WBC: 10.2 10*3/uL (ref 4.0–10.5)
nRBC: 0 % (ref 0.0–0.2)

## 2019-09-12 LAB — ABO/RH: ABO/RH(D): B POS

## 2019-09-15 ENCOUNTER — Other Ambulatory Visit (HOSPITAL_COMMUNITY)
Admission: RE | Admit: 2019-09-15 | Discharge: 2019-09-15 | Disposition: A | Discharge status: Home / Self Care | Payer: 59 | Source: Ambulatory Visit | Attending: Obstetrics and Gynecology | Admitting: Obstetrics and Gynecology

## 2019-09-15 DIAGNOSIS — Z01812 Encounter for preprocedural laboratory examination: Secondary | ICD-10-CM | POA: Diagnosis not present

## 2019-09-15 DIAGNOSIS — Z20822 Contact with and (suspected) exposure to covid-19: Secondary | ICD-10-CM | POA: Insufficient documentation

## 2019-09-15 LAB — SARS CORONAVIRUS 2 (TAT 6-24 HRS): SARS Coronavirus 2: NEGATIVE

## 2019-09-17 MED FILL — SPIRONOLACTONE 25 MG TABS: 25 | 90 days supply | Qty: 90 | Fill #2

## 2019-09-18 ENCOUNTER — Ambulatory Visit (HOSPITAL_BASED_OUTPATIENT_CLINIC_OR_DEPARTMENT_OTHER)
Admission: RE | Admit: 2019-09-18 | Discharge: 2019-09-19 | Disposition: A | Discharge status: Home / Self Care | Payer: 59 | Attending: Obstetrics and Gynecology | Admitting: Obstetrics and Gynecology

## 2019-09-18 ENCOUNTER — Encounter (HOSPITAL_BASED_OUTPATIENT_CLINIC_OR_DEPARTMENT_OTHER): Payer: Self-pay | Admitting: Obstetrics and Gynecology

## 2019-09-18 ENCOUNTER — Encounter (HOSPITAL_BASED_OUTPATIENT_CLINIC_OR_DEPARTMENT_OTHER)
Admission: RE | Disposition: A | Discharge status: Home / Self Care | Payer: Self-pay | Source: Home / Self Care | Attending: Obstetrics and Gynecology

## 2019-09-18 ENCOUNTER — Ambulatory Visit (HOSPITAL_BASED_OUTPATIENT_CLINIC_OR_DEPARTMENT_OTHER): Payer: 59 | Admitting: Anesthesiology

## 2019-09-18 ENCOUNTER — Other Ambulatory Visit: Payer: Self-pay

## 2019-09-18 DIAGNOSIS — N950 Postmenopausal bleeding: Secondary | ICD-10-CM | POA: Diagnosis present

## 2019-09-18 DIAGNOSIS — D259 Leiomyoma of uterus, unspecified: Secondary | ICD-10-CM | POA: Diagnosis not present

## 2019-09-18 DIAGNOSIS — Z6841 Body Mass Index (BMI) 40.0 and over, adult: Secondary | ICD-10-CM | POA: Diagnosis not present

## 2019-09-18 DIAGNOSIS — I10 Essential (primary) hypertension: Secondary | ICD-10-CM | POA: Insufficient documentation

## 2019-09-18 DIAGNOSIS — D250 Submucous leiomyoma of uterus: Secondary | ICD-10-CM | POA: Insufficient documentation

## 2019-09-18 DIAGNOSIS — N800 Endometriosis of uterus: Secondary | ICD-10-CM | POA: Insufficient documentation

## 2019-09-18 DIAGNOSIS — Z90710 Acquired absence of both cervix and uterus: Secondary | ICD-10-CM | POA: Diagnosis present

## 2019-09-18 DIAGNOSIS — E119 Type 2 diabetes mellitus without complications: Secondary | ICD-10-CM | POA: Insufficient documentation

## 2019-09-18 DIAGNOSIS — D251 Intramural leiomyoma of uterus: Secondary | ICD-10-CM | POA: Diagnosis not present

## 2019-09-18 DIAGNOSIS — E785 Hyperlipidemia, unspecified: Secondary | ICD-10-CM | POA: Diagnosis not present

## 2019-09-18 DIAGNOSIS — R9389 Abnormal findings on diagnostic imaging of other specified body structures: Secondary | ICD-10-CM | POA: Insufficient documentation

## 2019-09-18 HISTORY — PX: ROBOTIC ASSISTED LAPAROSCOPIC HYSTERECTOMY AND SALPINGECTOMY: SHX6379

## 2019-09-18 LAB — TYPE AND SCREEN
ABO/RH(D): B POS
Antibody Screen: NEGATIVE

## 2019-09-18 LAB — GLUCOSE, CAPILLARY: Glucose-Capillary: 125 mg/dL — ABNORMAL HIGH (ref 70–99)

## 2019-09-18 SURGERY — XI ROBOTIC ASSISTED LAPAROSCOPIC HYSTERECTOMY AND SALPINGECTOMY
Anesthesia: General | Site: Abdomen | Laterality: Bilateral

## 2019-09-18 MED ORDER — KETOROLAC TROMETHAMINE 30 MG/ML IJ SOLN
INTRAMUSCULAR | Status: DC | PRN
  Administered 2019-09-18: 30 mg via INTRAVENOUS

## 2019-09-18 MED ORDER — KETOROLAC TROMETHAMINE 30 MG/ML IJ SOLN
INTRAMUSCULAR | Status: AC
  Filled 2019-09-18: qty 1

## 2019-09-18 MED ORDER — LIDOCAINE 2% (20 MG/ML) 5 ML SYRINGE
INTRAMUSCULAR | Status: AC
  Filled 2019-09-18: qty 5

## 2019-09-18 MED ORDER — MENTHOL 3 MG MT LOZG: 1.0000 | LOZENGE | OROMUCOSAL | Status: DC | PRN

## 2019-09-18 MED ORDER — KETOROLAC TROMETHAMINE 30 MG/ML IJ SOLN
30.0000 mg | Freq: Four times a day (QID) | INTRAMUSCULAR | Status: DC
  Administered 2019-09-18 – 2019-09-19 (×3): 30 mg via INTRAVENOUS

## 2019-09-18 MED ORDER — ONDANSETRON HCL 4 MG PO TABS: 4.0000 mg | ORAL_TABLET | Freq: Four times a day (QID) | ORAL | Status: DC | PRN

## 2019-09-18 MED ORDER — PANTOPRAZOLE SODIUM 40 MG PO TBEC
DELAYED_RELEASE_TABLET | ORAL | Status: AC
  Filled 2019-09-18: qty 1

## 2019-09-18 MED ORDER — ONDANSETRON HCL 4 MG/2ML IJ SOLN
INTRAMUSCULAR | Status: AC
  Filled 2019-09-18: qty 2

## 2019-09-18 MED ORDER — SCOPOLAMINE 1 MG/3DAYS TD PT72
1.0000 | MEDICATED_PATCH | TRANSDERMAL | Status: DC
  Administered 2019-09-18: 1.5 mg via TRANSDERMAL

## 2019-09-18 MED ORDER — OXYCODONE HCL 5 MG PO TABS
5.0000 mg | ORAL_TABLET | ORAL | Status: DC | PRN
  Administered 2019-09-18 – 2019-09-19 (×4): 5 mg via ORAL

## 2019-09-18 MED ORDER — PANTOPRAZOLE SODIUM 40 MG PO TBEC
40.0000 mg | DELAYED_RELEASE_TABLET | Freq: Every day | ORAL | Status: DC
  Administered 2019-09-18: 40 mg via ORAL

## 2019-09-18 MED ORDER — SODIUM CHLORIDE 0.9 % IR SOLN
Status: DC | PRN
  Administered 2019-09-18: 3000 mL

## 2019-09-18 MED ORDER — SUGAMMADEX SODIUM 200 MG/2ML IV SOLN
INTRAVENOUS | Status: DC | PRN
  Administered 2019-09-18: 220 mg via INTRAVENOUS

## 2019-09-18 MED ORDER — MEPERIDINE HCL 25 MG/ML IJ SOLN: 6.2500 mg | INTRAMUSCULAR | Status: DC | PRN

## 2019-09-18 MED ORDER — ATENOLOL 50 MG PO TABS
50.0000 mg | ORAL_TABLET | Freq: Every day | ORAL | Status: DC
  Administered 2019-09-18: 50 mg via ORAL
  Filled 2019-09-18: qty 1

## 2019-09-18 MED ORDER — HYDROMORPHONE HCL 1 MG/ML IJ SOLN
INTRAMUSCULAR | Status: AC
  Filled 2019-09-18: qty 1

## 2019-09-18 MED ORDER — LACTATED RINGERS IV SOLN: INTRAVENOUS | Status: DC

## 2019-09-18 MED ORDER — CEFAZOLIN SODIUM-DEXTROSE 2-4 GM/100ML-% IV SOLN
INTRAVENOUS | Status: AC
  Filled 2019-09-18: qty 100

## 2019-09-18 MED ORDER — PANTOPRAZOLE SODIUM 40 MG PO TBEC: 40.0000 mg | DELAYED_RELEASE_TABLET | Freq: Every day | ORAL | Status: DC

## 2019-09-18 MED ORDER — ONDANSETRON HCL 4 MG/2ML IJ SOLN
INTRAMUSCULAR | Status: DC | PRN
  Administered 2019-09-18: 4 mg via INTRAVENOUS

## 2019-09-18 MED ORDER — PROPOFOL 10 MG/ML IV BOLUS
INTRAVENOUS | Status: DC | PRN
  Administered 2019-09-18: 200 mg via INTRAVENOUS

## 2019-09-18 MED ORDER — HYDROMORPHONE HCL 1 MG/ML IJ SOLN
INTRAMUSCULAR | Status: DC | PRN
  Administered 2019-09-18: .5 mg via INTRAVENOUS

## 2019-09-18 MED ORDER — ROCURONIUM BROMIDE 10 MG/ML (PF) SYRINGE
PREFILLED_SYRINGE | INTRAVENOUS | Status: DC | PRN
  Administered 2019-09-18: 20 mg via INTRAVENOUS
  Administered 2019-09-18: 10 mg via INTRAVENOUS
  Administered 2019-09-18: 60 mg via INTRAVENOUS

## 2019-09-18 MED ORDER — HYDROMORPHONE HCL 1 MG/ML IJ SOLN
0.2500 mg | INTRAMUSCULAR | Status: DC | PRN
  Administered 2019-09-18: 0.25 mg via INTRAVENOUS

## 2019-09-18 MED ORDER — FENTANYL CITRATE (PF) 100 MCG/2ML IJ SOLN
INTRAMUSCULAR | Status: DC | PRN
  Administered 2019-09-18: 50 ug via INTRAVENOUS
  Administered 2019-09-18: 100 ug via INTRAVENOUS
  Administered 2019-09-18: 50 ug via INTRAVENOUS
  Administered 2019-09-18 (×2): 25 ug via INTRAVENOUS

## 2019-09-18 MED ORDER — SPIRONOLACTONE 25 MG PO TABS
50.0000 mg | ORAL_TABLET | Freq: Every day | ORAL | Status: DC
  Administered 2019-09-18: 50 mg via ORAL
  Filled 2019-09-18: qty 2

## 2019-09-18 MED ORDER — OXYCODONE HCL 5 MG/5ML PO SOLN: 5.0000 mg | Freq: Once | ORAL | Status: DC | PRN

## 2019-09-18 MED ORDER — CEFAZOLIN SODIUM-DEXTROSE 2-4 GM/100ML-% IV SOLN
2.0000 g | INTRAVENOUS | Status: AC
  Administered 2019-09-18: 2 g via INTRAVENOUS

## 2019-09-18 MED ORDER — PROPOFOL 10 MG/ML IV BOLUS
INTRAVENOUS | Status: AC
  Filled 2019-09-18: qty 20

## 2019-09-18 MED ORDER — MIDAZOLAM HCL 5 MG/5ML IJ SOLN
INTRAMUSCULAR | Status: DC | PRN
  Administered 2019-09-18: 2 mg via INTRAVENOUS

## 2019-09-18 MED ORDER — ARTIFICIAL TEARS OPHTHALMIC OINT
TOPICAL_OINTMENT | OPHTHALMIC | Status: AC
  Filled 2019-09-18: qty 3.5

## 2019-09-18 MED ORDER — OXYCODONE HCL 5 MG PO TABS
ORAL_TABLET | ORAL | Status: AC
  Filled 2019-09-18: qty 1

## 2019-09-18 MED ORDER — DEXAMETHASONE SODIUM PHOSPHATE 10 MG/ML IJ SOLN
INTRAMUSCULAR | Status: AC
  Filled 2019-09-18: qty 1

## 2019-09-18 MED ORDER — ONDANSETRON HCL 4 MG/2ML IJ SOLN: 4.0000 mg | Freq: Four times a day (QID) | INTRAMUSCULAR | Status: DC | PRN

## 2019-09-18 MED ORDER — ONDANSETRON HCL 4 MG/2ML IJ SOLN: 4.0000 mg | Freq: Once | INTRAMUSCULAR | Status: DC | PRN

## 2019-09-18 MED ORDER — IBUPROFEN 800 MG PO TABS: 800.0000 mg | ORAL_TABLET | Freq: Four times a day (QID) | ORAL | Status: DC

## 2019-09-18 MED ORDER — SUGAMMADEX SODIUM 500 MG/5ML IV SOLN
INTRAVENOUS | Status: AC
  Filled 2019-09-18: qty 5

## 2019-09-18 MED ORDER — SIMETHICONE 80 MG PO CHEW: 80.0000 mg | CHEWABLE_TABLET | Freq: Four times a day (QID) | ORAL | Status: DC | PRN

## 2019-09-18 MED ORDER — FENTANYL CITRATE (PF) 250 MCG/5ML IJ SOLN
INTRAMUSCULAR | Status: AC
  Filled 2019-09-18: qty 5

## 2019-09-18 MED ORDER — DEXAMETHASONE SODIUM PHOSPHATE 10 MG/ML IJ SOLN
INTRAMUSCULAR | Status: DC | PRN
  Administered 2019-09-18: 4 mg via INTRAVENOUS

## 2019-09-18 MED ORDER — DEXTROSE IN LACTATED RINGERS 5 % IV SOLN: INTRAVENOUS | Status: DC

## 2019-09-18 MED ORDER — OXYCODONE HCL 5 MG PO TABS: 5.0000 mg | ORAL_TABLET | Freq: Once | ORAL | Status: DC | PRN

## 2019-09-18 MED ORDER — SCOPOLAMINE 1 MG/3DAYS TD PT72
MEDICATED_PATCH | TRANSDERMAL | Status: AC
  Filled 2019-09-18: qty 1

## 2019-09-18 MED ORDER — BUPIVACAINE HCL (PF) 0.25 % IJ SOLN
INTRAMUSCULAR | Status: DC | PRN
  Administered 2019-09-18: 10 mL

## 2019-09-18 MED ORDER — FLUTICASONE PROPIONATE 50 MCG/ACT NA SUSP
2.0000 | Freq: Every day | NASAL | Status: DC
  Administered 2019-09-18: 2 via NASAL
  Filled 2019-09-18: qty 16

## 2019-09-18 MED ORDER — MIDAZOLAM HCL 2 MG/2ML IJ SOLN
INTRAMUSCULAR | Status: AC
  Filled 2019-09-18: qty 2

## 2019-09-18 MED ORDER — LIDOCAINE 2% (20 MG/ML) 5 ML SYRINGE
INTRAMUSCULAR | Status: DC | PRN
  Administered 2019-09-18: 100 mg via INTRAVENOUS

## 2019-09-18 MED ORDER — ROCURONIUM BROMIDE 10 MG/ML (PF) SYRINGE
PREFILLED_SYRINGE | INTRAVENOUS | Status: AC
  Filled 2019-09-18: qty 10

## 2019-09-18 SURGICAL SUPPLY — 63 items
APPLICATOR ARISTA FLEXITIP XL (MISCELLANEOUS) ×3 IMPLANT
CATH FOLEY 3WAY  5CC 16FR (CATHETERS) ×3
CATH FOLEY 3WAY 5CC 16FR (CATHETERS) ×1 IMPLANT
COVER BACK TABLE 60X90IN (DRAPES) ×3 IMPLANT
COVER TIP SHEARS 8 DVNC (MISCELLANEOUS) ×1 IMPLANT
COVER TIP SHEARS 8MM DA VINCI (MISCELLANEOUS) ×3
DEFOGGER SCOPE WARMER CLEARIFY (MISCELLANEOUS) ×3 IMPLANT
DERMABOND ADVANCED (GAUZE/BANDAGES/DRESSINGS) ×2
DERMABOND ADVANCED .7 DNX12 (GAUZE/BANDAGES/DRESSINGS) ×1 IMPLANT
DRAPE ARM DVNC X/XI (DISPOSABLE) ×4 IMPLANT
DRAPE COLUMN DVNC XI (DISPOSABLE) ×1 IMPLANT
DRAPE DA VINCI XI ARM (DISPOSABLE) ×12
DRAPE DA VINCI XI COLUMN (DISPOSABLE) ×3
DURAPREP 26ML APPLICATOR (WOUND CARE) ×3 IMPLANT
ELECT REM PT RETURN 9FT ADLT (ELECTROSURGICAL) ×3
ELECTRODE REM PT RTRN 9FT ADLT (ELECTROSURGICAL) ×1 IMPLANT
GLOVE BIO SURGEON STRL SZ 6.5 (GLOVE) ×2 IMPLANT
GLOVE BIO SURGEON STRL SZ7.5 (GLOVE) ×3 IMPLANT
GLOVE BIO SURGEONS STRL SZ 6.5 (GLOVE) ×1
GLOVE BIOGEL PI IND STRL 6.5 (GLOVE) ×1 IMPLANT
GLOVE BIOGEL PI IND STRL 7.0 (GLOVE) ×6 IMPLANT
GLOVE BIOGEL PI IND STRL 7.5 (GLOVE) ×3 IMPLANT
GLOVE BIOGEL PI INDICATOR 6.5 (GLOVE) ×2
GLOVE BIOGEL PI INDICATOR 7.0 (GLOVE) ×12
GLOVE BIOGEL PI INDICATOR 7.5 (GLOVE) ×6
GLOVE ECLIPSE 6.5 STRL STRAW (GLOVE) ×9 IMPLANT
GLOVE ECLIPSE 7.5 STRL STRAW (GLOVE) ×3 IMPLANT
HEMOSTAT ARISTA ABSORB 3G PWDR (HEMOSTASIS) ×3 IMPLANT
HOLDER FOLEY CATH W/STRAP (MISCELLANEOUS) ×3 IMPLANT
IRRIG SUCT STRYKERFLOW 2 WTIP (MISCELLANEOUS) ×3
IRRIGATION SUCT STRKRFLW 2 WTP (MISCELLANEOUS) ×1 IMPLANT
LEGGING LITHOTOMY PAIR STRL (DRAPES) ×3 IMPLANT
MANIFOLD NEPTUNE II (INSTRUMENTS) ×3 IMPLANT
NEEDLE INSUFFLATION 120MM (ENDOMECHANICALS) ×3 IMPLANT
OBTURATOR OPTICAL STANDARD 8MM (TROCAR) ×3
OBTURATOR OPTICAL STND 8 DVNC (TROCAR) ×1
OBTURATOR OPTICALSTD 8 DVNC (TROCAR) ×1 IMPLANT
OCCLUDER COLPOPNEUMO (BALLOONS) ×3 IMPLANT
PACK ROBOT WH (CUSTOM PROCEDURE TRAY) ×3 IMPLANT
PACK ROBOTIC GOWN (GOWN DISPOSABLE) ×3 IMPLANT
PACK TRENDGUARD 450 HYBRID PRO (MISCELLANEOUS) ×1 IMPLANT
PAD PREP 24X48 CUFFED NSTRL (MISCELLANEOUS) ×3 IMPLANT
PROTECTOR NERVE ULNAR (MISCELLANEOUS) ×6 IMPLANT
SEAL CANN UNIV 5-8 DVNC XI (MISCELLANEOUS) ×4 IMPLANT
SEAL XI 5MM-8MM UNIVERSAL (MISCELLANEOUS) ×12
SEALER VESSEL DA VINCI XI (MISCELLANEOUS) ×3
SEALER VESSEL EXT DVNC XI (MISCELLANEOUS) ×1 IMPLANT
SET IRRIG Y TYPE TUR BLADDER L (SET/KITS/TRAYS/PACK) IMPLANT
SET TRI-LUMEN FLTR TB AIRSEAL (TUBING) ×3 IMPLANT
SOL ANTI FOG 6CC (MISCELLANEOUS) ×1 IMPLANT
SOLUTION ANTI FOG 6CC (MISCELLANEOUS) ×2
SUT VIC AB 0 CT1 27 (SUTURE) ×6
SUT VIC AB 0 CT1 27XBRD ANBCTR (SUTURE) ×2 IMPLANT
SUT VIC AB 0 CT1 36 (SUTURE) ×6 IMPLANT
SUT VIC AB 4-0 PS2 18 (SUTURE) ×3 IMPLANT
SUT VICRYL 4-0 PS2 18IN ABS (SUTURE) ×6 IMPLANT
SUT VLOC 180 0 9IN  GS21 (SUTURE) ×3
SUT VLOC 180 0 9IN GS21 (SUTURE) ×1 IMPLANT
TIP UTERINE 6.7X8CM BLUE DISP (MISCELLANEOUS) ×3 IMPLANT
TOWEL OR 17X26 10 PK STRL BLUE (TOWEL DISPOSABLE) ×6 IMPLANT
TRENDGUARD 450 HYBRID PRO PACK (MISCELLANEOUS) ×3
TROCAR PORT AIRSEAL 8X120 (TROCAR) ×3 IMPLANT
WATER STERILE IRR 1000ML POUR (IV SOLUTION) ×3 IMPLANT

## 2019-09-18 NOTE — Anesthesia Preprocedure Evaluation (Signed)
Anesthesia Evaluation  Patient identified by MRN, date of birth, ID band Patient awake    Reviewed: Allergy & Precautions, NPO status , Patient's Chart, lab work & pertinent test results, reviewed documented beta blocker date and time   Airway Mallampati: III  TM Distance: >3 FB Neck ROM: Full    Dental no notable dental hx. (+) Teeth Intact, Caps, Dental Advisory Given   Pulmonary shortness of breath and with exertion,    Pulmonary exam normal breath sounds clear to auscultation       Cardiovascular hypertension, Pt. on medications and Pt. on home beta blockers Normal cardiovascular exam+ Valvular Problems/Murmurs  Rhythm:Regular Rate:Normal     Neuro/Psych PSYCHIATRIC DISORDERS Depression negative neurological ROS     GI/Hepatic Neg liver ROS, GERD  Medicated and Controlled,  Endo/Other  diabetes, Well Controlled, Type 2, Oral Hypoglycemic AgentsMorbid obesityHyperlipidemia  Renal/GU negative Renal ROS  negative genitourinary   Musculoskeletal  (+) Arthritis , Osteoarthritis,    Abdominal (+) + obese,   Peds  Hematology  (+) Sickle cell trait and anemia ,   Anesthesia Other Findings   Reproductive/Obstetrics Post menopausal bleeding Submucous fibriod Thickened endometrium                             Anesthesia Physical Anesthesia Plan  ASA: III  Anesthesia Plan: General   Post-op Pain Management:    Induction: Intravenous and Cricoid pressure planned  PONV Risk Score and Plan: 4 or greater and Scopolamine patch - Pre-op, Midazolam, Ondansetron and Treatment may vary due to age or medical condition  Airway Management Planned: Oral ETT  Additional Equipment:   Intra-op Plan:   Post-operative Plan: Extubation in OR  Informed Consent: I have reviewed the patients History and Physical, chart, labs and discussed the procedure including the risks, benefits and alternatives for the  proposed anesthesia with the patient or authorized representative who has indicated his/her understanding and acceptance.     Dental advisory given  Plan Discussed with: CRNA and Surgeon  Anesthesia Plan Comments:         Anesthesia Quick Evaluation

## 2019-09-18 NOTE — Transfer of Care (Signed)
Immediate Anesthesia Transfer of Care Note  Patient: Leslie [name]  Procedure(s) Performed: Procedure(s) (LRB): XI ROBOTIC ASSISTED LAPAROSCOPIC HYSTERECTOMY AND SALPINGECTOMY (Bilateral)  Patient Location: PACU  Anesthesia Type: General  Level of Consciousness: awake, oriented, sedated and patient cooperative  Airway & Oxygen Therapy: Patient Spontanous Breathing and Patient connected to face mask oxygen  Post-op Assessment: Report given to PACU RN and Post -op Vital signs reviewed and stable  Post vital signs: Reviewed and stable  Complications: No apparent anesthesia complications Last Vitals:  Vitals Value Taken Time  BP 177/90 09/18/19 1317  Temp    Pulse 75 09/18/19 1327  Resp 16 09/18/19 1327  SpO2 92 % 09/18/19 1327  Vitals shown include unvalidated device data.  Last Pain:  Vitals:   09/18/19 1318  TempSrc:   PainSc: (P) 9       Patients Stated Pain Goal: 4 (09/18/19 0900)

## 2019-09-18 NOTE — Brief Op Note (Signed)
09/18/2019  1:08 PM  PATIENT:  Leslie [name]  59 y.o. female  PRE-OPERATIVE DIAGNOSIS:  Postmenopausal Bleeding, Endometrial Thickening, Fibroid uterus  POST-OPERATIVE DIAGNOSIS:  Postmenopausal Bleeding, Endometrial Thickening, Submucosal Fibroid  PROCEDURE:  Da Vinci robotic total hysterectomy, bilateral salpingectomy  SURGEON:  Surgeon(s) and Role:    * Servando Salina, MD - Primary  PHYSICIAN ASSISTANT:   ASSISTANTS: Gaylord Shih, RNFA   ANESTHESIA:   none Findings: fibroid uterus, nl tubes and ovaries, nl appendix EBL:  50 mL   BLOOD ADMINISTERED:none  DRAINS: none   LOCAL MEDICATIONS USED:  MARCAINE     SPECIMEN:  Source of Specimen:  uterus, cervix tubes  DISPOSITION OF SPECIMEN:  PATHOLOGY  COUNTS:  YES  TOURNIQUET:  * No tourniquets in log *  DICTATION: .Other Dictation: Dictation Number [id number]  PLAN OF CARE: Admit for overnight observation  PATIENT DISPOSITION:  PACU - hemodynamically stable.   Delay start of Pharmacological VTE agent (>24hrs) due to surgical blood loss or risk of bleeding: no

## 2019-09-18 NOTE — Anesthesia Procedure Notes (Signed)
Procedure Name: Intubation Date/Time: 09/18/2019 10:26 AM Performed by: Bonney Aid, CRNA Pre-anesthesia Checklist: Patient identified, Emergency Drugs available, Suction available and Patient being monitored Patient Re-evaluated:Patient Re-evaluated prior to induction Oxygen Delivery Method: Circle system utilized Preoxygenation: Pre-oxygenation with 100% oxygen Induction Type: IV induction Ventilation: Mask ventilation without difficulty Laryngoscope Size: Mac and 3 Grade View: Grade II Tube type: Oral Tube size: 7.5 mm Number of attempts: 1 Airway Equipment and Method: Stylet Placement Confirmation: ETT inserted through vocal cords under direct vision,  positive ETCO2 and breath sounds checked- equal and bilateral Secured at: 21 cm Tube secured with: Tape Dental Injury: Teeth and Oropharynx as per pre-operative assessment

## 2019-09-18 NOTE — Anesthesia Postprocedure Evaluation (Signed)
Anesthesia Post Note  Patient: Leslie [name]  Procedure(s) Performed: XI ROBOTIC ASSISTED LAPAROSCOPIC HYSTERECTOMY AND SALPINGECTOMY (Bilateral Abdomen)     Patient location during evaluation: PACU Anesthesia Type: General Level of consciousness: awake and alert and oriented Pain management: pain level controlled Vital Signs Assessment: post-procedure vital signs reviewed and stable Respiratory status: spontaneous breathing, nonlabored ventilation and respiratory function stable Cardiovascular status: blood pressure returned to baseline and stable Postop Assessment: no apparent nausea or vomiting Anesthetic complications: no    Last Vitals:  Vitals:   09/18/19 1442 09/18/19 1515  BP: (!) 166/79 (!) 148/76  Pulse: 66 71  Resp: 16 16  Temp: 37.1 C 37.1 C  SpO2: 100% 100%    Last Pain:  Vitals:   09/18/19 1515  TempSrc:   PainSc: Asleep                 Sanyiah Kanzler A.

## 2019-09-19 DIAGNOSIS — N950 Postmenopausal bleeding: Secondary | ICD-10-CM | POA: Diagnosis not present

## 2019-09-19 LAB — URINALYSIS, ROUTINE W REFLEX MICROSCOPIC
Bilirubin Urine: NEGATIVE
Glucose, UA: NEGATIVE mg/dL
Ketones, ur: NEGATIVE mg/dL
Nitrite: POSITIVE — AB
Protein, ur: NEGATIVE mg/dL
Specific Gravity, Urine: 1.012 (ref 1.005–1.030)
pH: 5 (ref 5.0–8.0)

## 2019-09-19 LAB — CBC
HCT: 27.6 % — ABNORMAL LOW (ref 36.0–46.0)
Hemoglobin: 8.7 g/dL — ABNORMAL LOW (ref 12.0–15.0)
MCH: 25.1 pg — ABNORMAL LOW (ref 26.0–34.0)
MCHC: 31.5 g/dL (ref 30.0–36.0)
MCV: 79.8 fL — ABNORMAL LOW (ref 80.0–100.0)
Platelets: 260 10*3/uL (ref 150–400)
RBC: 3.46 MIL/uL — ABNORMAL LOW (ref 3.87–5.11)
RDW: 14.7 % (ref 11.5–15.5)
WBC: 16 10*3/uL — ABNORMAL HIGH (ref 4.0–10.5)
nRBC: 0 % (ref 0.0–0.2)

## 2019-09-19 LAB — BASIC METABOLIC PANEL
Anion gap: 5 (ref 5–15)
BUN: 27 mg/dL — ABNORMAL HIGH (ref 6–20)
CO2: 21 mmol/L — ABNORMAL LOW (ref 22–32)
Calcium: 8.5 mg/dL — ABNORMAL LOW (ref 8.9–10.3)
Chloride: 107 mmol/L (ref 98–111)
Creatinine, Ser: 1.34 mg/dL — ABNORMAL HIGH (ref 0.44–1.00)
GFR calc Af Amer: 51 mL/min — ABNORMAL LOW (ref >60)
GFR calc non Af Amer: 44 mL/min — ABNORMAL LOW (ref >60)
Glucose, Bld: 237 mg/dL — ABNORMAL HIGH (ref 70–99)
Potassium: 4.7 mmol/L (ref 3.5–5.1)
Sodium: 133 mmol/L — ABNORMAL LOW (ref 135–145)

## 2019-09-19 LAB — SURGICAL PATHOLOGY

## 2019-09-19 MED ORDER — OXYCODONE HCL 5 MG PO TABS
ORAL_TABLET | ORAL | Status: AC
  Filled 2019-09-19: qty 1

## 2019-09-19 MED ORDER — KETOROLAC TROMETHAMINE 30 MG/ML IJ SOLN
INTRAMUSCULAR | Status: AC
  Filled 2019-09-19: qty 1

## 2019-09-19 MED ORDER — IBUPROFEN 800 MG PO TABS: 800.0000 mg | ORAL_TABLET | Freq: Four times a day (QID) | ORAL | 5 refills | Status: DC | PRN

## 2019-09-19 MED ORDER — LACTATED RINGERS IV BOLUS
1000.0000 mL | Freq: Once | INTRAVENOUS | Status: AC
  Administered 2019-09-19: 1000 mL via INTRAVENOUS

## 2019-09-19 MED ORDER — OXYCODONE HCL 5 MG PO TABS: 5.0000 mg | ORAL_TABLET | ORAL | 0 refills | Status: DC | PRN

## 2019-09-19 MED FILL — oxyCODONE HCL 5 MG TABS: 5 | 2 days supply | Qty: 30 | Fill #0

## 2019-09-19 MED FILL — IBUPROFEN 800 MG TAB: 800 | 7 days supply | Qty: 30 | Fill #0

## 2019-09-19 NOTE — Op Note (Signed)
NAME: Leslie [name], Leslie [name] MEDICAL RECORD [id number] ACCOUNT [id number] DATE OF BIRTH:[birth year] FACILITY: [facility] LOCATION: [facility] PHYSICIAN:Irisa Grimsley A. Marguarite Markov, MD  OPERATIVE REPORT  DATE OF PROCEDURE:  09/18/2019  PREOPERATIVE DIAGNOSES:  Persistent postmenopausal bleeding, fibroid uterus.  PROCEDURE:  Da Vinci robotic total hysterectomy, bilateral salpingectomy.  POSTOPERATIVE DIAGNOSES:  Persistent postmenopausal bleeding, fibroid uterus.  ANESTHESIA:  General.  SURGEON:  Servando Salina, MD  ASSISTANT:  Gaylord Shih, RNFA  DESCRIPTION OF PROCEDURE:  Under adequate general anesthesia, the patient was positioned for robotic surgery.  She was placed in the dorsal lithotomy position.  She was sterilely prepped and draped in usual fashion.  A 3-way Foley catheter was sterilely  placed.  The urine was draining Pyridium-colored urine.  A weighted speculum was placed in the vagina.  Sims retractor was placed anteriorly.  A 2-0 Vicryl figure-of-eight suture placed on the anterior and posterior lip of the cervix.  The uterus sounded to 10 cm.  An #8 uterine manipulator was placed.  The retractors were removed and attention was then turned to the abdomen.  The patient had a short torso. Therefore, the incisions were placed superior to the umbilicus with [id number] Marcaine injected supraumbilically. Vertical incision was then made.  A Veress needle was introduced.  Opening pressure of 4 was noted.  Three liters of CO2 was insufflated.  Veress needle was then removed.  A robotic 8 mm port was inserted without incident.  The robotic camera was then inserted confirming entry into the abdomen without incident.  Additional robotic ports were placed, 2 on the left and 1 on the far right with the intervening 8 mm AirSeal placement.  The robot was then  docked.  In arm #1 is the vessel sealer, arm #4 was the monopolar scissors, arm #3 was the long bipolar forceps grasper.  I then went to the surgical  console.  At the surgical console, the pelvis was further inspected.  Normal liver edge had been noted.   Normal appendix was noted.  Fibroid uterus was noted.  Normal tubes and ovaries were noted bilaterally.  The procedure was started by identifying the ureters bilaterally.  There were adhesions in the posterior cul-de-sac, which was lysed.  Small filmy adhesions around the left fallopian tube were also lysed.  The left fallopian tube was grasped and the underlying mesosalpinx was serially clamped, cauterized and cut using the vessel sealer and the tube removed. The retroperitoneal space  was opened and window was placed in the posterior leaf of the broad ligament.  The left uteroovarian ligament was then serially clamped, cauterized, and cut.  The posterior broad leaf was further carried down inferiorly.  The round ligament was clamped,  cauterized, and cut.  That incision was extended anteriorly to the anterior leaf of the broad ligament, was opened to the vesicouterine peritoneum anteriorly with the bladder displaced inferiorly with sharp dissection.  Uterine vessels were then  skeletonized on the left.  The left uterine vessels were clamped, cauterized and then cut.  Procedure was performed on the contralateral side using the same procedure as described after again identifying the right ureter and starting with the right fallopian tube with its removal, followed by opening the right retroperitoneal space and isolating the right uteroovarian ligament. The right uterine vessels were then skeletonized, after completely opening the vesicouterine peritoneum and displacing the bladder inferiorly with sharp and blunt dissection.  Once the uterine vessels were skeletonized, clamped, cauterized, and  cut, the vaginal insufflator was done.  The vesicouterine peritoneum  was further displaced inferiorly and using the cervicovaginal junction, the cervix was severed from its vaginal attachment anteriorly and carried  around circumferentially with subsequent removal of the uterus with traction and countertraction which I assisted with due to the narrow vagina and shape of the specimen.  The uterus was subsequently removed.  I went back to the surgical console.  The insufflator was reinserted.  Small bleeding along the vaginal cuff was carefully cauterized.  There was an extension anteriorly on the vaginal cuff and a 0 Vicryl figure-of-eight suture x2 was placed to approximate that extension.  The vaginal cuff was then closed in 2-layer closure using 0 V-Loc running stitch.  With good  hemostasis was achieved the instruments were exchanged with the vessel sealer being replaced by the long tip forceps and the monopolar scissors being replaced by the large mega suture needle driver.  The abdominal pressure was decreased and a small  bleeder was identified on the left, which was carefully cauterized. The ureters were again inspected with peristalsis noted well bilaterally  Once good hemostasis was again achieved and the abdomen was irrigated and suctioned, Arista potato starch was placed overlying the vaginal cuff.  Under direct visualization, the robotic ports  were removed.  The robot was undocked.  I went back to the patient's bedside sterilely and under direct visualization, the ports were removed.  Abdomen was deflated and the incisions were closed with 4-0 Vicryl subcuticular closure.  Vaginal cuff was inspected intraoperatively and postprocedure with good approximation noted.  SPECIMEN:  Uterus with cervix, fallopian tubes sent to pathology.  Weight of 299.4 grams.  INTRAOPERATIVE FLUIDS:  1 liter.  URINE OUTPUT:  300 mL.  ESTIMATED BLOOD LOSS:  20 mL.  COMPLICATIONS:  None.  DISPOSITION:  The patient tolerated the procedure well and was transferred to recovery room in stable condition.  CN/NUANCE  D:09/18/2019 T:09/19/2019 JOB:[id number]

## 2019-09-19 NOTE — Progress Notes (Signed)
Subjective: Patient reports tolerating PO and no problems voiding c/o urine orange color. Denies urinary burning Denies back pain.    Objective: I have reviewed patient's vital signs.  vital signs, intake and output, and labs. Vitals:   09/19/19 0430 09/19/19 0702  BP: 103/61   Pulse: 71   Resp: 14   Temp: 99.6 F (37.6 C) 99.1 F (37.3 C)  SpO2: 98%    I/O last 3 completed shifts: In: 2175 [I.V.:2075; IV Piggyback:100] Out: 745 [Urine:725; Blood:20] Total I/O In: -  Out: 300 [Urine:300]  Lab Results  Component Value Date   WBC 16.0 (H) 09/19/2019   HGB 8.7 (L) 09/19/2019   HCT 27.6 (L) 09/19/2019   MCV 79.8 (L) 09/19/2019   PLT 260 09/19/2019   Lab Results  Component Value Date   CREATININE 1.34 (H) 09/19/2019    EXAM General: alert, cooperative and no distress Resp: clear to auscultation bilaterally Cardio: regular rate and rhythm, S1, S2 normal, no murmur, click, rub or gallop GI: soft, non-tender; bowel sounds normal; no masses,  no organomegaly and incision: clean, dry and intact Extremities: no edema, redness or tenderness in the calves or thighs Vaginal Bleeding: none Back: no CVAT Assessment: s/p Procedure(s): XI ROBOTIC ASSISTED LAPAROSCOPIC HYSTERECTOMY AND SALPINGECTOMY: stable, progressing well, tolerating diet, and anemia Abnl creatinine. Reviewed prior Cr levels. Note elevated BUN. Suspect Dehyration(aggravated by bowel prep).  Plan: Encourage ambulation Discharge home Check u/a. IVF bolus ( one liter) D/c instructions reviewed Will have repeat labs in office next wk Disc need to increase po fluid intake Warning signs w/r to back pain reviewed with pt F/u 2 wk and next wk for lab  LOS: 0 days    Marvene Staff, MD 09/19/2019 8:17 AM    09/19/2019, 8:17 AM

## 2019-09-19 NOTE — Discharge Instructions (Signed)
Laparoscopically Assisted Vaginal Hysterectomy, Care After This sheet gives you information about how to care for yourself after your procedure. Your health care provider may also give you more specific instructions. If you have problems or questions, contact your health care provider. What can I expect after the procedure? After the procedure, it is common to have:  Soreness and numbness in your incision areas.  Abdominal pain. You will be given pain medicine to control it.  Vaginal bleeding and discharge. You will need to use a sanitary napkin after this procedure.  Sore throat from the breathing tube that was inserted during surgery. Follow these instructions at home: Medicines  Take over-the-counter and prescription medicines only as told by your health care provider.  Do not take aspirin or ibuprofen. These medicines can cause bleeding.  Do not drive or use heavy machinery while taking prescription pain medicine.  Do not drive for 24 hours if you were given a medicine to help you relax (sedative) during the procedure. Incision care   Follow instructions from your health care provider about how to take care of your incisions. Make sure you: ? Wash your hands with soap and water before you change your bandage (dressing). If soap and water are not available, use hand sanitizer. ? Change your dressing as told by your health care provider. ? Leave stitches (sutures), skin glue, or adhesive strips in place. These skin closures may need to stay in place for 2 weeks or longer. If adhesive strip edges start to loosen and curl up, you may trim the loose edges. Do not remove adhesive strips completely unless your health care provider tells you to do that.  Check your incision area every day for signs of infection. Check for: ? Redness, swelling, or pain. ? Fluid or blood. ? Warmth. ? Pus or a bad smell. Activity  Get regular exercise as told by your health care provider. You may be  told to take short walks every day and go farther each time.  Return to your normal activities as told by your health care provider. Ask your health care provider what activities are safe for you.  Do not douche, use tampons, or have sexual intercourse for at least 6 weeks, or until your health care provider gives you permission.  Do not lift anything that is heavier than 10 lb (4.5 kg), or the limit that your health care provider tells you, until he or she says that it is safe. General instructions  Do not take baths, swim, or use a hot tub until your health care provider approves. Take showers instead of baths.  Do not drive for 24 hours if you received a sedative.  Do not drive or operate heavy machinery while taking prescription pain medicine.  To prevent or treat constipation while you are taking prescription pain medicine, your health care provider may recommend that you: ? Drink enough fluid to keep your urine clear or pale yellow. ? Take over-the-counter or prescription medicines. ? Eat foods that are high in fiber, such as fresh fruits and vegetables, whole grains, and beans. ? Limit foods that are high in fat and processed sugars, such as fried and sweet foods.  Keep all follow-up visits as told by your health care provider. This is important. Contact a health care provider if:  You have signs of infection, such as: ? Redness, swelling, or pain around your incision sites. ? Fluid or blood coming from an incision. ? An incision that feels warm to the   touch. ? Pus or a bad smell coming from an incision.  Your incision breaks open.  Your pain medicine is not helping.  You feel dizzy or light-headed.  You have pain or bleeding when you urinate.  You have persistent nausea and vomiting.  You have blood, pus, or a bad-smelling discharge from your vagina. Get help right away if:  You have a fever.  You have severe abdominal pain.  You have chest pain.  You have  shortness of breath.  You faint.  You have pain, swelling, or redness in your leg.  You have heavy bleeding from your vagina. Summary  After the procedure, it is common to have abdominal pain and vaginal bleeding.  You should not drive or lift heavy objects until your health care provider says that it is safe.  Contact your health care provider if you have any symptoms of infection, excessive vaginal bleeding, nausea, vomiting, or shortness of breath. This information is not intended to replace advice given to you by your health care provider. Make sure you discuss any questions you have with your health care provider. Document Revised: 03/30/2017 Document Reviewed: 06/13/2016 Elsevier Patient Education  2020 [employer]. Call if temperature greater than equal to 100.4, nothing per vagina for 4-6 weeks or severe nausea vomiting, increased incisional pain , drainage or redness in the incision site, no straining with bowel movements, showers no bath

## 2019-09-20 NOTE — Discharge Summary (Signed)
Physician Discharge Summary  Patient ID: Leslie [name] MRN: [id number] DOB/AGE: 05/06/1960 59 y.o.  Admit date: 09/18/2019 Discharge date: 09/19/2019  Admission Diagnoses: postmenopausal bleeding, fibroid uterus ( IM/SS/SM fibroid) Morbid obesity  Discharge Diagnoses: same  Active Problems:   Postmenopausal bleeding   Status post total hysterectomy   Discharged Condition: stable  Hospital Course:  Pt underwent da Vanci robotic total hysterectomy, bilateral salpingectomy. uncomplicated Postoperative course . Noted to be poss dehydration based on BUN/Creatinine elevation w/o any other sx  Consults: None  Significant Diagnostic Studies: labs:  CBC Latest Ref Rng & Units 09/19/2019 09/12/2019 02/04/2019  WBC 4.0 - 10.5 K/uL 16.0(H) 10.2 9.7  Hemoglobin 12.0 - 15.0 g/dL 8.7(L) 10.9(L) 11.6  Hematocrit 36.0 - 46.0 % 27.6(L) 34.7(L) 36.9  Platelets 150 - 400 K/uL 260 342 315   BMP Latest Ref Rng & Units 09/19/2019 09/12/2019 06/05/2019  Glucose 70 - 99 mg/dL 237(H) 111(H) 149(H)  BUN 6 - 20 mg/dL 27(H) 20 17  Creatinine 0.44 - 1.00 mg/dL 1.34(H) 0.97 1.20(H)  BUN/Creat Ratio 9 - 23 - - 14  Sodium 135 - 145 mmol/L 133(L) 135 139  Potassium 3.5 - 5.1 mmol/L 4.7 4.4 4.5  Chloride 98 - 111 mmol/L 107 105 105  CO2 22 - 32 mmol/L 21(L) 21(L) 19(L)  Calcium 8.9 - 10.3 mg/dL 8.5(L) 9.5 9.7    Treatments: surgery: Da vinci robotic total hysterectomy,bilateral salpingectomy  Discharge Exam: Blood pressure 129/61, pulse 66, temperature 99.6 F (37.6 C), resp. rate 14, height 5\' 2"  (1.575 m), weight 106.5 kg, last menstrual period 09/07/2019, SpO2 97 %. General appearance: alert, cooperative and no distress Back: no tenderness to percussion or palpation Resp: clear to auscultation bilaterally Cardio: regular rate and rhythm, S1, S2 normal, no murmur, click, rub or gallop GI: obese, soft (+) BS. incision: well approximate d/c/i Pelvic: deferred Extremities: no edema, redness or tenderness  in the calves or thighs  Disposition: Discharge disposition: 01-Home or Self Care       Discharge Instructions    Call MD for:  persistant nausea and vomiting   Complete by: As directed    Call MD for:  severe uncontrolled pain   Complete by: As directed    Call MD for:  temperature >100.4   Complete by: As directed    Diet - low sodium heart healthy   Complete by: As directed    Increase activity slowly   Complete by: As directed      Allergies as of 09/19/2019   No Known Allergies     Medication List    STOP taking these medications   buPROPion 150 MG 12 hr tablet Commonly known as: Wellbutrin SR   metFORMIN 500 MG tablet Commonly known as: GLUCOPHAGE     TAKE these medications   atenolol 50 MG tablet Commonly known as: TENORMIN Take 50 mg by mouth daily.   atorvastatin 10 MG tablet Commonly known as: LIPITOR Take 10 mg by mouth daily.   cetirizine 10 MG tablet Commonly known as: ZYRTEC Take 1 tablet (10 mg total) by mouth as needed for allergies. What changed: when to take this   dexlansoprazole 60 MG capsule Commonly known as: DEXILANT Take 60 mg by mouth daily as needed (Heartburn).   ferrous sulfate 325 (65 FE) MG tablet Take 325 mg by mouth daily with breakfast.   fluticasone 50 MCG/ACT nasal spray Commonly known as: FLONASE Place 2 sprays into both nostrils daily.   ibuprofen 800 MG tablet Commonly known  as: ADVIL Take 1 tablet (800 mg total) by mouth every 6 (six) hours as needed for moderate pain. Notes to patient: Next dose is due at 1 pm as needed for pain   oxyCODONE 5 MG immediate release tablet Commonly known as: Oxy IR/ROXICODONE Take 1-2 tablets (5-10 mg total) by mouth every 4 (four) hours as needed for moderate pain. Notes to patient: Next dose is due at 1230 as needed for pain   spironolactone 50 MG tablet Commonly known as: ALDACTONE Take 50 mg by mouth daily.   Vitamin D (Ergocalciferol) 1.25 MG (50000 UNIT) Caps  capsule Commonly known as: DRISDOL Take 1 capsule (50,000 Units total) by mouth every 7 (seven) days.      Follow-up Information    Servando Salina, MD In 2 weeks.   Specialty: Obstetrics and Gynecology Contact information: [address] [place] [address] [phone]           Signed: Marvene Staff 09/20/2019, 10:34 PM

## 2019-09-23 DIAGNOSIS — Z9889 Other specified postprocedural states: Secondary | ICD-10-CM | POA: Diagnosis not present

## 2019-10-02 MED FILL — FLUTICASONE PROP 50 MCG SPR: 50 | 30 days supply | Qty: 16 | Fill #3

## 2019-10-03 ENCOUNTER — Other Ambulatory Visit: Payer: Self-pay | Admitting: Obstetrics and Gynecology

## 2019-10-03 DIAGNOSIS — R350 Frequency of micturition: Secondary | ICD-10-CM | POA: Diagnosis not present

## 2019-10-23 DIAGNOSIS — Z09 Encounter for follow-up examination after completed treatment for conditions other than malignant neoplasm: Secondary | ICD-10-CM | POA: Diagnosis not present

## 2019-10-31 MED FILL — FLUTICASONE PROP 50 MCG SPR: 50 | 30 days supply | Qty: 16 | Fill #4

## 2019-11-03 MED FILL — ATENOLOL 50 MG TABLET: 50 | 90 days supply | Qty: 90 | Fill #2

## 2019-11-12 MED FILL — VITAMIN D3 50 MCG (2000 UT): 50 MCG | 100 days supply | Qty: 100 | Fill #2

## 2019-11-14 MED FILL — VITAMIN D3 50,000 UNITS CAP: 1.25 MG | 28 days supply | Qty: 4 | Fill #3

## 2019-12-06 MED FILL — ATORVASTATIN CALCIUM 10 MG: 10 | 90 days supply | Qty: 90 | Fill #3

## 2019-12-16 MED FILL — SPIRONOLACTONE 25 MG TABS: 25 | 90 days supply | Qty: 90 | Fill #3

## 2019-12-17 DIAGNOSIS — Z1231 Encounter for screening mammogram for malignant neoplasm of breast: Secondary | ICD-10-CM | POA: Diagnosis not present

## 2019-12-27 ENCOUNTER — Ambulatory Visit: Payer: 59 | Attending: Internal Medicine

## 2019-12-30 DIAGNOSIS — N6002 Solitary cyst of left breast: Secondary | ICD-10-CM | POA: Diagnosis not present

## 2019-12-30 DIAGNOSIS — R922 Inconclusive mammogram: Secondary | ICD-10-CM | POA: Diagnosis not present

## 2020-01-13 ENCOUNTER — Ambulatory Visit: Payer: 59 | Attending: Internal Medicine

## 2020-01-13 DIAGNOSIS — Z23 Encounter for immunization: Secondary | ICD-10-CM

## 2020-01-13 NOTE — Progress Notes (Signed)
   Covid-19 Vaccination Clinic  Name:  JOSELINE MCCAMPBELL    MRN: [id number] DOB: Sep 15, 1960  01/13/2020  Ms. Demauro was observed post Covid-19 immunization for 15 minutes without incident. She was provided with Vaccine Information Sheet and instruction to access the V-Safe system.   Ms. Dinkel was instructed to call [phone] with any severe reactions post vaccine: [name] Difficulty breathing  . Swelling of face and throat  . A fast heartbeat  . A bad rash all over body  . Dizziness and weakness

## 2020-01-20 ENCOUNTER — Other Ambulatory Visit (HOSPITAL_COMMUNITY): Payer: Self-pay | Admitting: Family Medicine

## 2020-01-20 DIAGNOSIS — E559 Vitamin D deficiency, unspecified: Secondary | ICD-10-CM | POA: Diagnosis not present

## 2020-01-20 DIAGNOSIS — M170 Bilateral primary osteoarthritis of knee: Secondary | ICD-10-CM | POA: Diagnosis not present

## 2020-01-20 DIAGNOSIS — I10 Essential (primary) hypertension: Secondary | ICD-10-CM | POA: Diagnosis not present

## 2020-01-20 DIAGNOSIS — K219 Gastro-esophageal reflux disease without esophagitis: Secondary | ICD-10-CM | POA: Diagnosis not present

## 2020-01-20 DIAGNOSIS — E7800 Pure hypercholesterolemia, unspecified: Secondary | ICD-10-CM | POA: Diagnosis not present

## 2020-01-20 DIAGNOSIS — J309 Allergic rhinitis, unspecified: Secondary | ICD-10-CM | POA: Diagnosis not present

## 2020-01-20 DIAGNOSIS — R7303 Prediabetes: Secondary | ICD-10-CM | POA: Diagnosis not present

## 2020-01-20 MED FILL — MONTELUKAST SOD 10 MG TAB: 10 | 90 days supply | Qty: 90 | Fill #0

## 2020-01-20 MED FILL — ATENOLOL 50 MG TABLET: 50 | 90 days supply | Qty: 90 | Fill #0

## 2020-01-22 DIAGNOSIS — R7303 Prediabetes: Secondary | ICD-10-CM | POA: Diagnosis not present

## 2020-01-22 DIAGNOSIS — I10 Essential (primary) hypertension: Secondary | ICD-10-CM | POA: Diagnosis not present

## 2020-01-22 DIAGNOSIS — E7800 Pure hypercholesterolemia, unspecified: Secondary | ICD-10-CM | POA: Diagnosis not present

## 2020-01-22 DIAGNOSIS — E559 Vitamin D deficiency, unspecified: Secondary | ICD-10-CM | POA: Diagnosis not present

## 2020-02-04 ENCOUNTER — Other Ambulatory Visit (HOSPITAL_COMMUNITY): Payer: Self-pay | Admitting: Family Medicine

## 2020-02-04 MED FILL — metFORMIN HCL ER 500 MG TB2: 500 | 90 days supply | Qty: 90 | Fill #0

## 2020-02-13 ENCOUNTER — Other Ambulatory Visit (HOSPITAL_COMMUNITY): Payer: Self-pay | Admitting: Dermatology

## 2020-02-13 DIAGNOSIS — L309 Dermatitis, unspecified: Secondary | ICD-10-CM | POA: Diagnosis not present

## 2020-02-13 MED FILL — TRIAMCINOLONE 0.1% OINTMENT: 0.1 | 14 days supply | Qty: 80 | Fill #0

## 2020-03-05 MED FILL — ATORVASTATIN CALCIUM 10 MG: 10 | 90 days supply | Qty: 90 | Fill #0

## 2020-03-15 MED FILL — SPIRONOLACTONE 25 MG TABS: 25 | 90 days supply | Qty: 90 | Fill #0

## 2020-04-13 MED FILL — MONTELUKAST SOD 10 MG TAB: 10 | 90 days supply | Qty: 90 | Fill #1

## 2020-04-13 MED FILL — ATENOLOL 50 MG TABLET: 50 | 90 days supply | Qty: 90 | Fill #1

## 2020-04-28 MED FILL — metFORMIN HCL ER 500 MG TB2: 500 | 90 days supply | Qty: 90 | Fill #1

## 2020-05-28 MED FILL — ATORVASTATIN CALCIUM 10 MG: 10 | 90 days supply | Qty: 90 | Fill #1

## 2020-06-07 MED FILL — SPIRONOLACTONE 25 MG TABS: 25 | 90 days supply | Qty: 90 | Fill #1

## 2020-06-30 DIAGNOSIS — H2513 Age-related nuclear cataract, bilateral: Secondary | ICD-10-CM | POA: Diagnosis not present

## 2020-07-02 IMAGING — US US ABDOMEN LIMITED
1 series · 13 of 25 positions shown · non-contrast
Comparison: None.

CLINICAL DATA: Elevated liver function studies

EXAM:
ULTRASOUND ABDOMEN LIMITED RIGHT UPPER QUADRANT

[Series 1: us abdomen limited · 0.15mm/px · 13 of 46 slices shown]
[im 1/46]
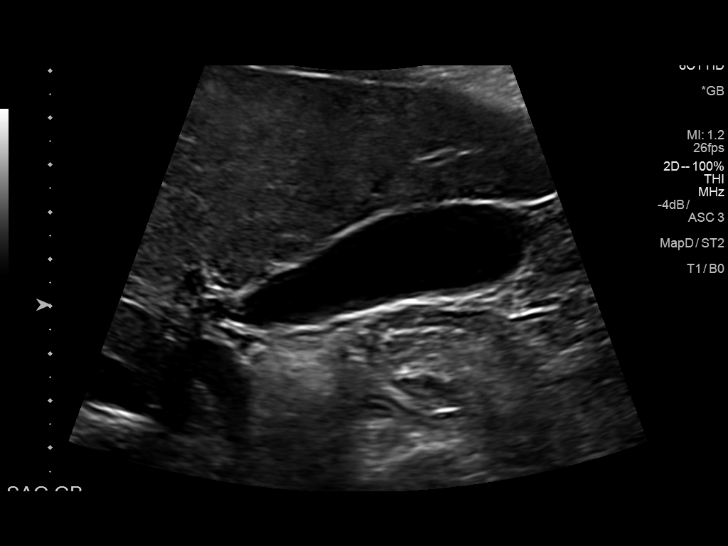
[im 4/46]
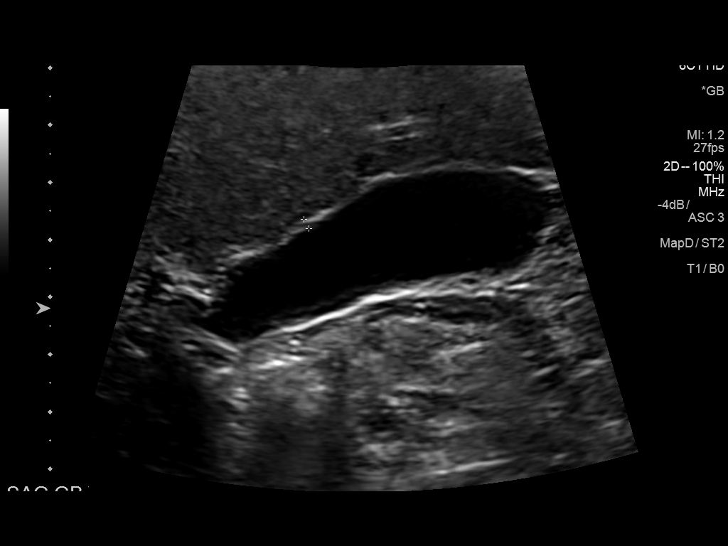
[im 8/46]
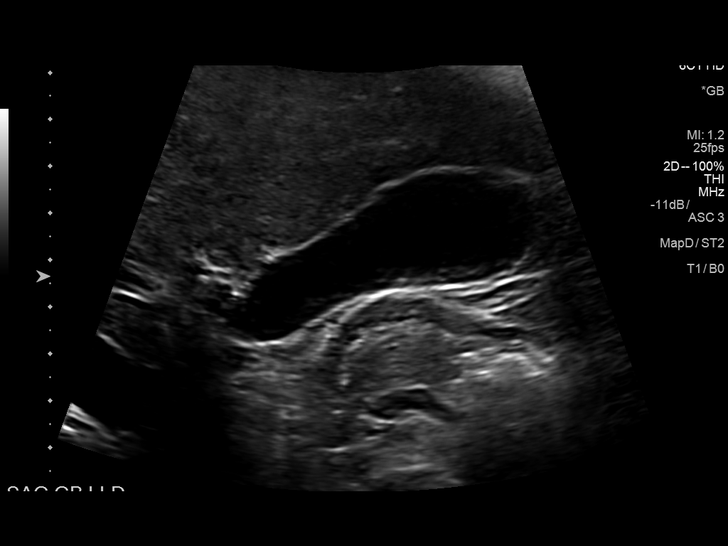
[im 12/46]
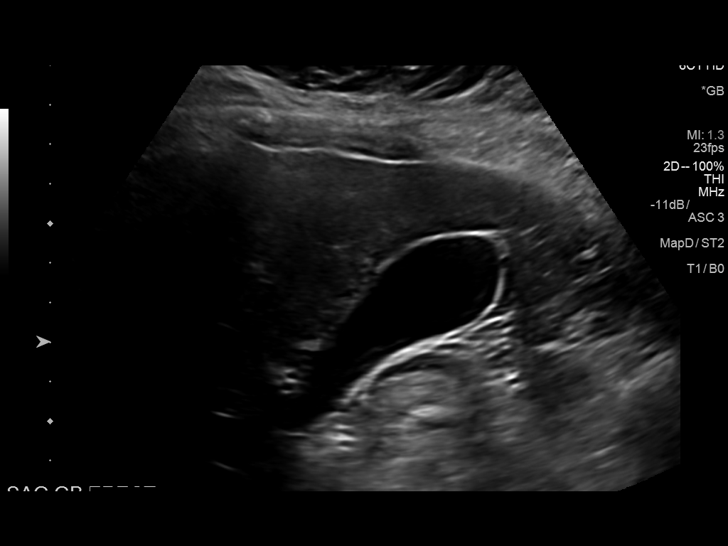
[im 16/46]
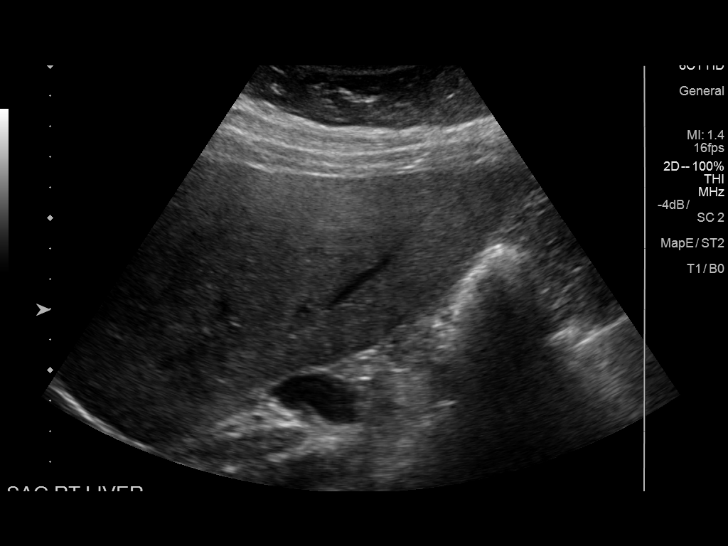
[im 19/46]
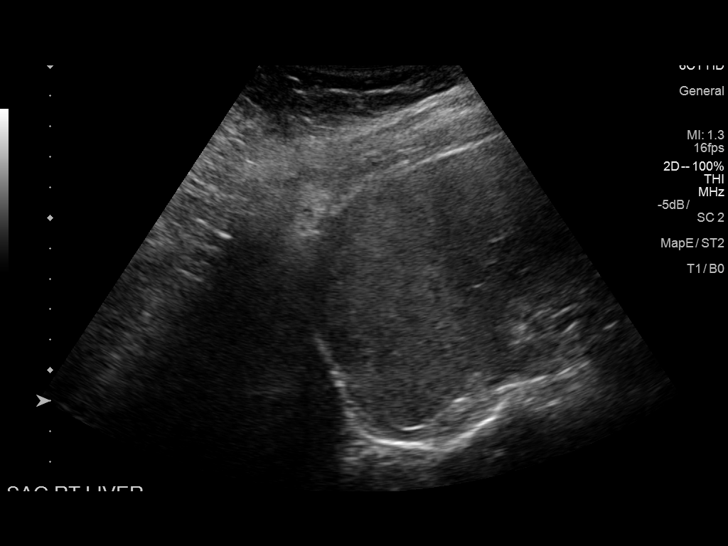
[im 23/46]
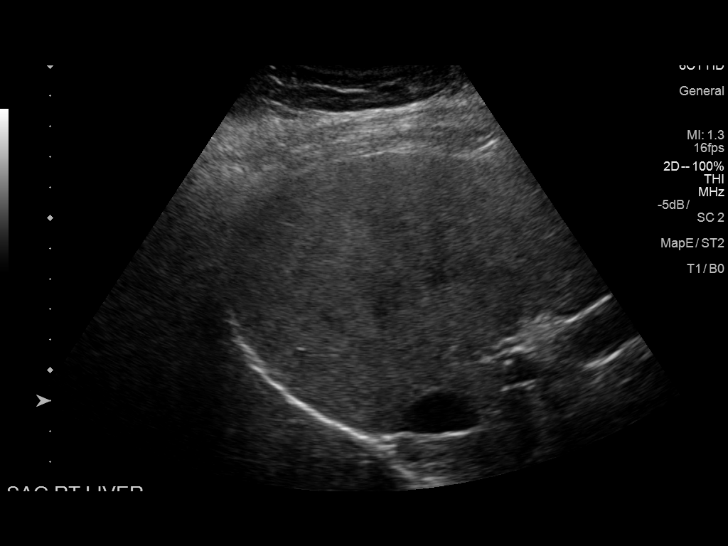
[im 27/46]
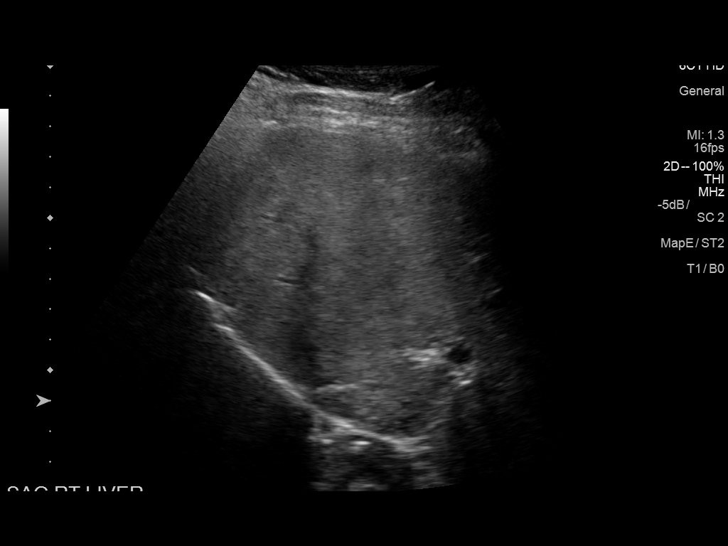
[im 31/46]
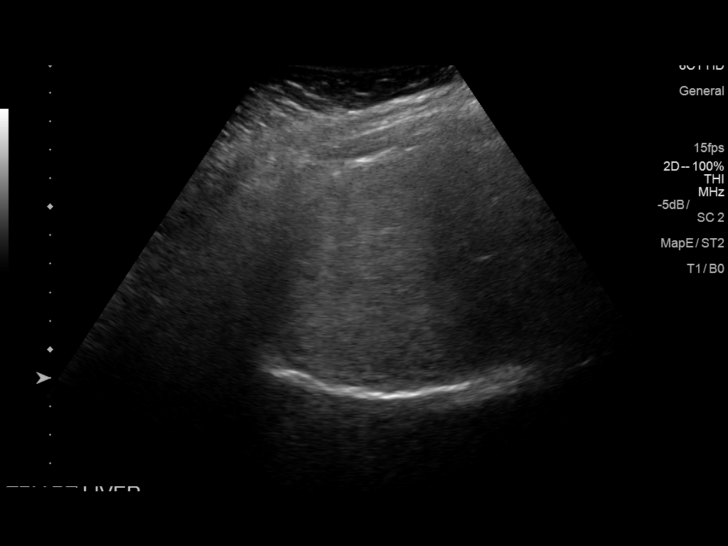
[im 34/46]
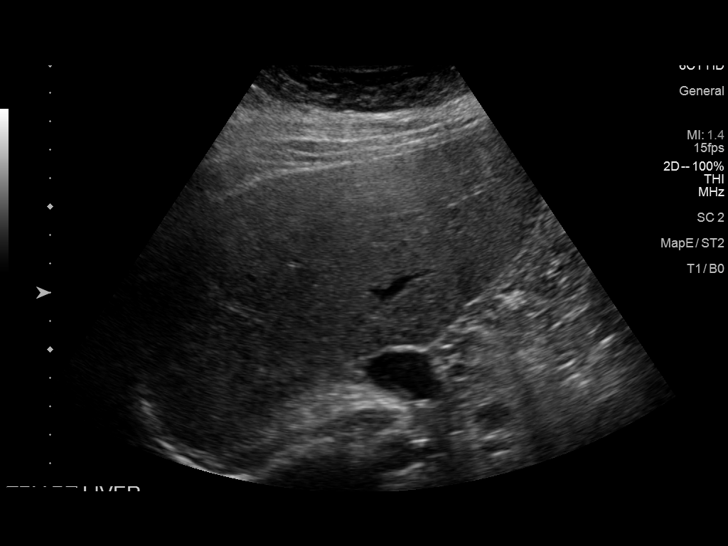
[im 38/46]
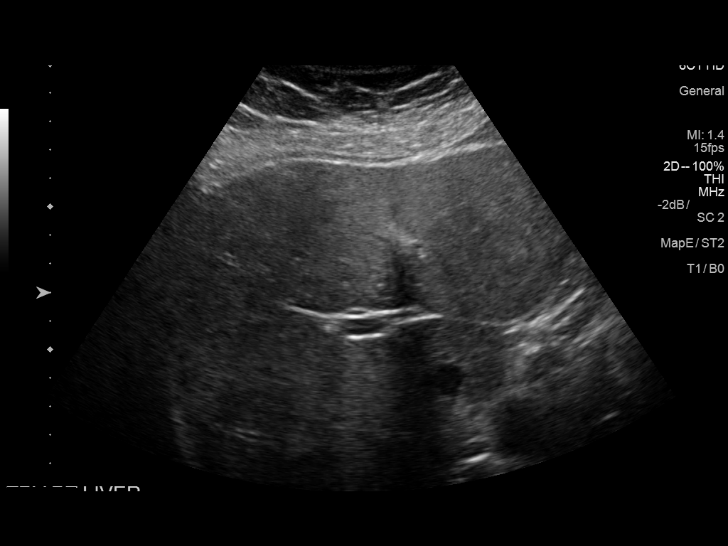
[im 42/46]
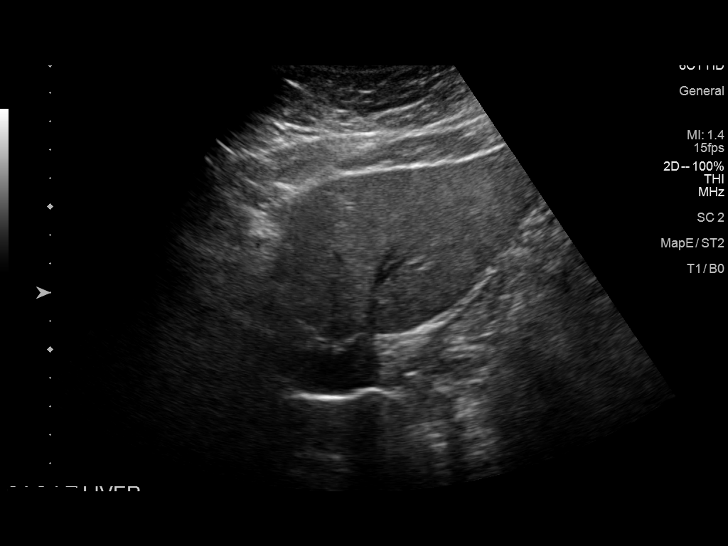
[im 46/46]
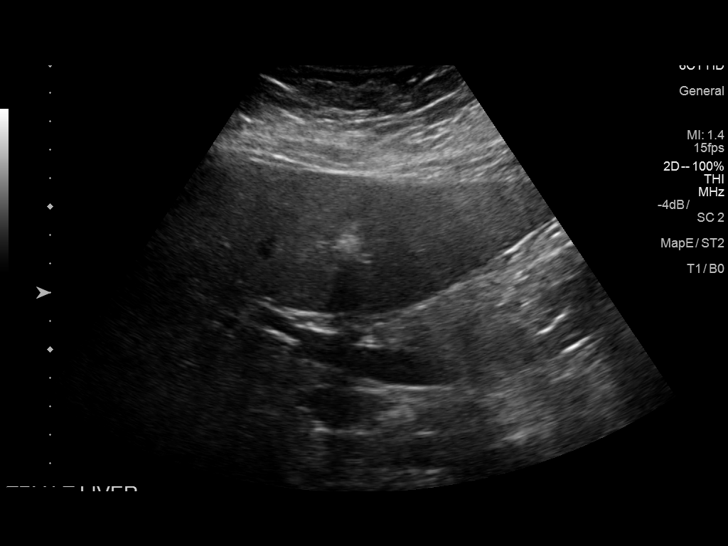

[13 of 25 positions shown; findings below may reference images not displayed]

FINDINGS: Gallbladder:

No gallstones or wall thickening visualized. No sonographic Murphy
sign noted by sonographer.

Common bile duct:

Diameter: 3.1 mm

Liver:

The hepatic echotexture is heterogeneously increased. The surface
contour of the liver remains smooth. There is a subcentimeter hyper
echo it focus in the left lobe with distal shadowing most compatible
with a hemangioma. There is an area of decreased echotexture in the
subcapsular region of the right lobe measuring 1 x 2.1 x 1.2 cm
which is nonspecific. There is no ductal dilation. Portal vein is
patent on color Doppler imaging with normal direction of blood flow
towards the liver.
IMPRESSION: Normal appearance of the gallbladder and common bile duct.

Increased hepatic echotexture most compatible with fatty
infiltrative change. Ill-defined hypoechoic focus in the subcapsular
region of the right hepatic lobe which is nonspecific. Focal fatty
sparing is in the differential but an abnormal mass is not excluded.
Hepatic protocol MRI is recommended.

Probable subcentimeter hemangioma or granuloma in the left hepatic
lobe.

## 2020-07-12 DIAGNOSIS — Z01818 Encounter for other preprocedural examination: Secondary | ICD-10-CM | POA: Diagnosis not present

## 2020-07-12 DIAGNOSIS — H2512 Age-related nuclear cataract, left eye: Secondary | ICD-10-CM | POA: Diagnosis not present

## 2020-07-12 DIAGNOSIS — E119 Type 2 diabetes mellitus without complications: Secondary | ICD-10-CM | POA: Diagnosis not present

## 2020-07-16 ENCOUNTER — Other Ambulatory Visit (HOSPITAL_COMMUNITY): Payer: Self-pay | Admitting: Ophthalmology

## 2020-07-20 ENCOUNTER — Other Ambulatory Visit (HOSPITAL_BASED_OUTPATIENT_CLINIC_OR_DEPARTMENT_OTHER): Payer: Self-pay

## 2020-07-21 DIAGNOSIS — H2512 Age-related nuclear cataract, left eye: Secondary | ICD-10-CM | POA: Diagnosis not present

## 2020-07-27 MED FILL — metFORMIN HCL ER 500 MG TB2: 500 | 90 days supply | Qty: 90 | Fill #2

## 2020-08-11 DIAGNOSIS — H2511 Age-related nuclear cataract, right eye: Secondary | ICD-10-CM | POA: Diagnosis not present

## 2020-08-16 DIAGNOSIS — M25562 Pain in left knee: Secondary | ICD-10-CM | POA: Diagnosis not present

## 2020-08-16 DIAGNOSIS — M25561 Pain in right knee: Secondary | ICD-10-CM | POA: Diagnosis not present

## 2020-08-27 ENCOUNTER — Other Ambulatory Visit (HOSPITAL_COMMUNITY): Payer: Self-pay

## 2020-08-27 MED FILL — Prednisolone Acetate Ophth Susp 1%: OPHTHALMIC | 30 days supply | Qty: 5 | Fill #0 | Status: AC

## 2020-09-01 DIAGNOSIS — M170 Bilateral primary osteoarthritis of knee: Secondary | ICD-10-CM | POA: Diagnosis not present

## 2020-09-08 DIAGNOSIS — M170 Bilateral primary osteoarthritis of knee: Secondary | ICD-10-CM | POA: Diagnosis not present

## 2020-09-09 ENCOUNTER — Other Ambulatory Visit: Payer: Self-pay

## 2020-09-09 ENCOUNTER — Other Ambulatory Visit (HOSPITAL_COMMUNITY): Payer: Self-pay

## 2020-09-09 MED ORDER — ONDANSETRON HCL 4 MG PO TABS
4.0000 mg | ORAL_TABLET | Freq: Three times a day (TID) | ORAL | 1 refills | Status: DC | PRN
  Filled 2020-09-09: qty 20, 7d supply, fill #0

## 2020-09-09 NOTE — Telephone Encounter (Signed)
Zofran 4 mg tablets every 8 hours as needed, # 20 with 1 RF's.RX for above e-scribed and sent to pharmacy on record  [address]. [place] [address] Phone: [phone] Fax: [phone]

## 2020-09-15 DIAGNOSIS — M170 Bilateral primary osteoarthritis of knee: Secondary | ICD-10-CM | POA: Diagnosis not present

## 2020-11-03 DIAGNOSIS — R7303 Prediabetes: Secondary | ICD-10-CM | POA: Diagnosis not present

## 2020-11-03 DIAGNOSIS — I10 Essential (primary) hypertension: Secondary | ICD-10-CM | POA: Diagnosis not present

## 2020-11-03 DIAGNOSIS — E559 Vitamin D deficiency, unspecified: Secondary | ICD-10-CM | POA: Diagnosis not present

## 2020-11-17 ENCOUNTER — Other Ambulatory Visit (HOSPITAL_COMMUNITY): Payer: Self-pay

## 2020-11-17 MED ORDER — CARESTART COVID-19 HOME TEST VI KIT
PACK | 0 refills | Status: DC
  Filled 2020-11-17: qty 4, 4d supply, fill #0

## 2020-11-17 MED FILL — Metformin HCl Tab ER 24HR 500 MG: ORAL | 90 days supply | Qty: 90 | Fill #0 | Status: AC

## 2020-11-17 MED FILL — Spironolactone Tab 25 MG: ORAL | 90 days supply | Qty: 90 | Fill #0 | Status: AC

## 2020-11-17 MED FILL — Atenolol Tab 50 MG: ORAL | 90 days supply | Qty: 90 | Fill #0 | Status: AC

## 2020-11-17 MED FILL — Atorvastatin Calcium Tab 10 MG (Base Equivalent): ORAL | 90 days supply | Qty: 90 | Fill #0 | Status: AC

## 2020-11-22 ENCOUNTER — Other Ambulatory Visit: Payer: Self-pay

## 2020-11-22 ENCOUNTER — Ambulatory Visit
Admission: RE | Admit: 2020-11-22 | Discharge: 2020-11-22 | Disposition: A | Discharge status: Home / Self Care | Payer: 59 | Source: Ambulatory Visit | Attending: Family Medicine | Admitting: Family Medicine

## 2020-11-22 ENCOUNTER — Other Ambulatory Visit (HOSPITAL_COMMUNITY): Payer: Self-pay

## 2020-11-22 DIAGNOSIS — R059 Cough, unspecified: Secondary | ICD-10-CM | POA: Diagnosis not present

## 2020-11-22 MED ORDER — BENZONATATE 200 MG PO CAPS
ORAL_CAPSULE | ORAL | 0 refills | Status: DC
  Filled 2020-11-22: qty 15, 5d supply, fill #0

## 2020-11-23 ENCOUNTER — Other Ambulatory Visit (HOSPITAL_COMMUNITY): Payer: Self-pay

## 2020-12-02 ENCOUNTER — Other Ambulatory Visit (HOSPITAL_COMMUNITY): Payer: Self-pay

## 2020-12-02 MED ORDER — ALBUTEROL SULFATE HFA 108 (90 BASE) MCG/ACT IN AERS
INHALATION_SPRAY | RESPIRATORY_TRACT | 0 refills | Status: DC
  Filled 2020-12-02: qty 18, 16d supply, fill #0

## 2020-12-22 DIAGNOSIS — N6002 Solitary cyst of left breast: Secondary | ICD-10-CM | POA: Diagnosis not present

## 2020-12-22 DIAGNOSIS — R922 Inconclusive mammogram: Secondary | ICD-10-CM | POA: Diagnosis not present

## 2021-01-19 ENCOUNTER — Other Ambulatory Visit (HOSPITAL_COMMUNITY): Payer: Self-pay

## 2021-01-19 DIAGNOSIS — Z23 Encounter for immunization: Secondary | ICD-10-CM | POA: Diagnosis not present

## 2021-01-19 DIAGNOSIS — I10 Essential (primary) hypertension: Secondary | ICD-10-CM | POA: Diagnosis not present

## 2021-01-19 DIAGNOSIS — K219 Gastro-esophageal reflux disease without esophagitis: Secondary | ICD-10-CM | POA: Diagnosis not present

## 2021-01-19 DIAGNOSIS — R7303 Prediabetes: Secondary | ICD-10-CM | POA: Diagnosis not present

## 2021-01-19 DIAGNOSIS — J309 Allergic rhinitis, unspecified: Secondary | ICD-10-CM | POA: Diagnosis not present

## 2021-01-19 DIAGNOSIS — E559 Vitamin D deficiency, unspecified: Secondary | ICD-10-CM | POA: Diagnosis not present

## 2021-01-19 DIAGNOSIS — R059 Cough, unspecified: Secondary | ICD-10-CM | POA: Diagnosis not present

## 2021-01-19 DIAGNOSIS — M170 Bilateral primary osteoarthritis of knee: Secondary | ICD-10-CM | POA: Diagnosis not present

## 2021-01-19 DIAGNOSIS — E7800 Pure hypercholesterolemia, unspecified: Secondary | ICD-10-CM | POA: Diagnosis not present

## 2021-01-19 MED ORDER — METFORMIN HCL ER 500 MG PO TB24
ORAL_TABLET | ORAL | 3 refills | Status: DC
  Filled 2021-01-19: qty 90, 90d supply, fill #0
  Filled 2021-10-17: qty 90, 90d supply, fill #1

## 2021-01-19 MED ORDER — SPIRONOLACTONE 25 MG PO TABS
ORAL_TABLET | ORAL | 3 refills | Status: DC
  Filled 2021-01-19: qty 90, 90d supply, fill #0
  Filled 2021-07-07: qty 90, 90d supply, fill #1
  Filled 2021-10-17: qty 90, 90d supply, fill #2

## 2021-01-19 MED ORDER — ATENOLOL 50 MG PO TABS
ORAL_TABLET | ORAL | 3 refills | Status: DC
  Filled 2021-01-19: qty 90, 90d supply, fill #0
  Filled 2021-09-14: qty 90, 90d supply, fill #1
  Filled 2021-12-26: qty 90, 90d supply, fill #2

## 2021-01-19 MED ORDER — ATORVASTATIN CALCIUM 10 MG PO TABS
ORAL_TABLET | ORAL | 3 refills | Status: DC
  Filled 2021-01-19: qty 90, 90d supply, fill #0
  Filled 2021-08-04: qty 90, 90d supply, fill #1
  Filled 2021-10-17: qty 90, 90d supply, fill #2

## 2021-01-28 ENCOUNTER — Other Ambulatory Visit (HOSPITAL_COMMUNITY): Payer: Self-pay

## 2021-02-10 ENCOUNTER — Telehealth: Payer: Self-pay | Admitting: Family

## 2021-02-10 ENCOUNTER — Other Ambulatory Visit (HOSPITAL_COMMUNITY): Payer: Self-pay

## 2021-02-10 MED ORDER — ALBUTEROL SULFATE HFA 108 (90 BASE) MCG/ACT IN AERS
INHALATION_SPRAY | RESPIRATORY_TRACT | 1 refills | Status: DC
  Filled 2021-02-10: qty 18, 16d supply, fill #0
  Filled 2021-07-26: qty 18, 16d supply, fill #1

## 2021-02-10 NOTE — Telephone Encounter (Signed)
Albuterol 108 MCG/ACT 2 puffs by mouth every 4 hours as needed. 18 gram with 1 RF's.Grayce Sessions for above e-scribed and sent to pharmacy on record  [place]. [place] [address] Phone: [phone] Fax: [phone]

## 2021-03-17 ENCOUNTER — Other Ambulatory Visit (HOSPITAL_COMMUNITY): Payer: Self-pay

## 2021-03-17 DIAGNOSIS — R059 Cough, unspecified: Secondary | ICD-10-CM | POA: Diagnosis not present

## 2021-03-17 MED ORDER — PREDNISONE 20 MG PO TABS
ORAL_TABLET | ORAL | 0 refills | Status: DC
  Filled 2021-03-17: qty 18, 9d supply, fill #0

## 2021-03-29 DIAGNOSIS — H26492 Other secondary cataract, left eye: Secondary | ICD-10-CM | POA: Diagnosis not present

## 2021-03-29 DIAGNOSIS — H26491 Other secondary cataract, right eye: Secondary | ICD-10-CM | POA: Diagnosis not present

## 2021-04-11 ENCOUNTER — Other Ambulatory Visit (HOSPITAL_COMMUNITY): Payer: Self-pay

## 2021-04-11 DIAGNOSIS — M25572 Pain in left ankle and joints of left foot: Secondary | ICD-10-CM | POA: Diagnosis not present

## 2021-04-11 DIAGNOSIS — Z23 Encounter for immunization: Secondary | ICD-10-CM | POA: Diagnosis not present

## 2021-04-11 MED ORDER — NITROGLYCERIN 0.2 MG/HR TD PT24
MEDICATED_PATCH | TRANSDERMAL | 0 refills | Status: AC
  Filled 2021-04-11: qty 10, 40d supply, fill #0

## 2021-04-12 DIAGNOSIS — M25572 Pain in left ankle and joints of left foot: Secondary | ICD-10-CM | POA: Diagnosis not present

## 2021-04-12 DIAGNOSIS — M6281 Muscle weakness (generalized): Secondary | ICD-10-CM | POA: Diagnosis not present

## 2021-04-12 DIAGNOSIS — M7662 Achilles tendinitis, left leg: Secondary | ICD-10-CM | POA: Diagnosis not present

## 2021-04-12 DIAGNOSIS — M25672 Stiffness of left ankle, not elsewhere classified: Secondary | ICD-10-CM | POA: Diagnosis not present

## 2021-05-09 DIAGNOSIS — M25572 Pain in left ankle and joints of left foot: Secondary | ICD-10-CM | POA: Diagnosis not present

## 2021-05-29 ENCOUNTER — Other Ambulatory Visit: Payer: Self-pay

## 2021-05-29 ENCOUNTER — Emergency Department (HOSPITAL_COMMUNITY): Payer: 59

## 2021-05-29 ENCOUNTER — Encounter (HOSPITAL_COMMUNITY): Payer: Self-pay

## 2021-05-29 ENCOUNTER — Emergency Department (HOSPITAL_COMMUNITY)
Admission: EM | Admit: 2021-05-29 | Discharge: 2021-05-30 | Disposition: A | Discharge status: Home / Self Care | Payer: 59 | Attending: Emergency Medicine | Admitting: Emergency Medicine

## 2021-05-29 DIAGNOSIS — R0600 Dyspnea, unspecified: Secondary | ICD-10-CM

## 2021-05-29 DIAGNOSIS — I10 Essential (primary) hypertension: Secondary | ICD-10-CM | POA: Insufficient documentation

## 2021-05-29 DIAGNOSIS — R7309 Other abnormal glucose: Secondary | ICD-10-CM | POA: Diagnosis not present

## 2021-05-29 DIAGNOSIS — R109 Unspecified abdominal pain: Secondary | ICD-10-CM | POA: Diagnosis not present

## 2021-05-29 DIAGNOSIS — R1013 Epigastric pain: Secondary | ICD-10-CM | POA: Diagnosis not present

## 2021-05-29 DIAGNOSIS — Z79899 Other long term (current) drug therapy: Secondary | ICD-10-CM | POA: Diagnosis not present

## 2021-05-29 DIAGNOSIS — R112 Nausea with vomiting, unspecified: Secondary | ICD-10-CM | POA: Insufficient documentation

## 2021-05-29 DIAGNOSIS — R0602 Shortness of breath: Secondary | ICD-10-CM | POA: Insufficient documentation

## 2021-05-29 DIAGNOSIS — Z7984 Long term (current) use of oral hypoglycemic drugs: Secondary | ICD-10-CM | POA: Insufficient documentation

## 2021-05-29 DIAGNOSIS — R079 Chest pain, unspecified: Secondary | ICD-10-CM | POA: Diagnosis not present

## 2021-05-29 DIAGNOSIS — D72829 Elevated white blood cell count, unspecified: Secondary | ICD-10-CM | POA: Diagnosis not present

## 2021-05-29 DIAGNOSIS — R1111 Vomiting without nausea: Secondary | ICD-10-CM | POA: Diagnosis not present

## 2021-05-29 LAB — CBC WITH DIFFERENTIAL/PLATELET
Abs Immature Granulocytes: 0.03 10*3/uL (ref 0.00–0.07)
Basophils Absolute: 0 10*3/uL (ref 0.0–0.1)
Basophils Relative: 0 %
Eosinophils Absolute: 0.1 10*3/uL (ref 0.0–0.5)
Eosinophils Relative: 1 %
HCT: 37.6 % (ref 36.0–46.0)
Hemoglobin: 12.2 g/dL (ref 12.0–15.0)
Immature Granulocytes: 0 %
Lymphocytes Relative: 19 %
Lymphs Abs: 2.2 10*3/uL (ref 0.7–4.0)
MCH: 25.6 pg — ABNORMAL LOW (ref 26.0–34.0)
MCHC: 32.4 g/dL (ref 30.0–36.0)
MCV: 78.8 fL — ABNORMAL LOW (ref 80.0–100.0)
Monocytes Absolute: 0.6 10*3/uL (ref 0.1–1.0)
Monocytes Relative: 5 %
Neutro Abs: 8.6 10*3/uL — ABNORMAL HIGH (ref 1.7–7.7)
Neutrophils Relative %: 75 %
Platelets: 314 10*3/uL (ref 150–400)
RBC: 4.77 MIL/uL (ref 3.87–5.11)
RDW: 14 % (ref 11.5–15.5)
WBC: 11.6 10*3/uL — ABNORMAL HIGH (ref 4.0–10.5)
nRBC: 0 % (ref 0.0–0.2)

## 2021-05-29 LAB — COMPREHENSIVE METABOLIC PANEL
ALT: 35 U/L (ref 0–44)
AST: 25 U/L (ref 15–41)
Albumin: 4.4 g/dL (ref 3.5–5.0)
Alkaline Phosphatase: 123 U/L (ref 38–126)
Anion gap: 7 (ref 5–15)
BUN: 12 mg/dL (ref 6–20)
CO2: 25 mmol/L (ref 22–32)
Calcium: 9.8 mg/dL (ref 8.9–10.3)
Chloride: 106 mmol/L (ref 98–111)
Creatinine, Ser: 0.76 mg/dL (ref 0.44–1.00)
GFR, Estimated: >60 mL/min (ref >60)
Glucose, Bld: 142 mg/dL — ABNORMAL HIGH (ref 70–99)
Potassium: 4.3 mmol/L (ref 3.5–5.1)
Sodium: 138 mmol/L (ref 135–145)
Total Bilirubin: 1 mg/dL (ref 0.3–1.2)
Total Protein: 8.3 g/dL — ABNORMAL HIGH (ref 6.5–8.1)

## 2021-05-29 LAB — CBC
HCT: 37.2 % (ref 36.0–46.0)
Hemoglobin: 12.1 g/dL (ref 12.0–15.0)
MCH: 25.4 pg — ABNORMAL LOW (ref 26.0–34.0)
MCHC: 32.5 g/dL (ref 30.0–36.0)
MCV: 78.2 fL — ABNORMAL LOW (ref 80.0–100.0)
Platelets: 306 10*3/uL (ref 150–400)
RBC: 4.76 MIL/uL (ref 3.87–5.11)
RDW: 13.8 % (ref 11.5–15.5)
WBC: 11.7 10*3/uL — ABNORMAL HIGH (ref 4.0–10.5)
nRBC: 0 % (ref 0.0–0.2)

## 2021-05-29 LAB — TROPONIN I (HIGH SENSITIVITY)
Troponin I (High Sensitivity): 22 ng/L — ABNORMAL HIGH (ref <18)
Troponin I (High Sensitivity): 11 ng/L (ref <18)

## 2021-05-29 LAB — POC OCCULT BLOOD, ED: Fecal Occult Bld: NEGATIVE

## 2021-05-29 LAB — LIPASE, BLOOD: Lipase: 29 U/L (ref 11–51)

## 2021-05-29 MED ORDER — SODIUM CHLORIDE 0.9 % IV BOLUS
1000.0000 mL | Freq: Once | INTRAVENOUS | Status: AC
  Administered 2021-05-29: 1000 mL via INTRAVENOUS

## 2021-05-29 MED ORDER — IOHEXOL 300 MG/ML  SOLN
100.0000 mL | Freq: Once | INTRAMUSCULAR | Status: AC | PRN
  Administered 2021-05-29: 100 mL via INTRAVENOUS

## 2021-05-29 MED ORDER — ONDANSETRON HCL 4 MG/2ML IJ SOLN
4.0000 mg | Freq: Once | INTRAMUSCULAR | Status: AC
  Administered 2021-05-29: 4 mg via INTRAVENOUS
  Filled 2021-05-29: qty 2

## 2021-05-29 MED ORDER — MORPHINE SULFATE (PF) 2 MG/ML IV SOLN
2.0000 mg | Freq: Once | INTRAVENOUS | Status: AC
  Administered 2021-05-29: 2 mg via INTRAVENOUS
  Filled 2021-05-29: qty 1

## 2021-05-29 MED ORDER — ONDANSETRON HCL 4 MG/2ML IJ SOLN: 4.0000 mg | Freq: Once | INTRAMUSCULAR | Status: DC | PRN

## 2021-05-29 MED ORDER — MORPHINE SULFATE (PF) 4 MG/ML IV SOLN
4.0000 mg | Freq: Once | INTRAVENOUS | Status: AC
  Administered 2021-05-29: 4 mg via INTRAVENOUS
  Filled 2021-05-29: qty 1

## 2021-05-29 NOTE — Discharge Instructions (Addendum)
Your work-up today was reassuring.  CT scan negative for acute findings.  Your blood work did not show concern for infection.  Electrolytes were normal.  You did have elevated cardiac enzyme initially which on repeat blood draw was normal.  Your EKG was also normal.  Chest x-ray without any concerns.  I have given you nausea medicine to keep on hand as well as Bentyl and pain medication.  Recommend you follow-up with your primary care provider for work-up and management of your abdominal pain.  If you have worsening symptoms, signs or symptoms of bleeding, chest pain please return to the emergency room for evaluation.

## 2021-05-29 NOTE — ED Provider Notes (Signed)
[facility] DEPT Provider Note   CSN: [id number] Arrival date & time: 05/29/21  1816     History  No chief complaint on file.   LACE CHENEVERT is a 61 y.o. female.  61 year old female presents today for evaluation of cute onset of abdominal pain that woke her up around 1 AM last night associated with nausea vomiting.  She denies preceding symptoms prior to last night.  Patient reports pain after vomiting resolved and she was able to fall back to sleep only to be woken up again with recurrent symptoms.  Patient reports she had about 8 episodes since about an hour prior to arrival when she called EMS.  She denies fever, chills, chest pain, dysuria.  She does endorse dark emesis but not completely black without coffee-ground appearance.  She also reports dark stools without bright red blood.  She denies dark stools prior to yesterday.  She states she is on iron supplement as needed but has not taken that in months.  Reports current abdominal pain is rated as 8/10.  Reports shortness of breath over the past week which is new for her.  Denies peripheral edema.  No prior history of CHF.  The history is provided by the patient. No language interpreter was used.      Home Medications Prior to Admission medications   Medication Sig Start Date End Date Taking? Authorizing Provider  albuterol (VENTOLIN HFA) 108 (90 Base) MCG/ACT inhaler Inhale 2 puffs by mouth into the lungs every 4 hours as needed 02/10/21   Paretta-Leahey, Haze Boyden, NP  atenolol (TENORMIN) 50 MG tablet Take 50 mg by mouth daily.    [provider]  atenolol (TENORMIN) 50 MG tablet Take 1 tablet by mouth once a day for blood pressure 01/19/21     atorvastatin (LIPITOR) 10 MG tablet Take 10 mg by mouth daily.    [provider]  atorvastatin (LIPITOR) 10 MG tablet TAKE 1 TABLET BY MOUTH ONCE DAILY FOR CHOLESTEROL 01/20/20 02/15/21  Gaynelle Arabian, MD  atorvastatin (LIPITOR) 10 MG  tablet Take 1 tablet by mouth once a day for cholesterol 01/19/21     benzonatate (TESSALON) 200 MG capsule Take 1 capsule by mouth 3 times daily as needed 11/22/20     cetirizine (ZYRTEC) 10 MG tablet Take 1 tablet (10 mg total) by mouth as needed for allergies. Patient taking differently: Take 10 mg by mouth daily.  07/09/19   Dennard Nip D, MD  COVID-19 At Home Antigen Test [facility] COVID-19 HOME TEST) KIT Use as directed within package instructions 11/17/20   [facility]  dexlansoprazole (DEXILANT) 60 MG capsule Take 60 mg by mouth daily as needed (Heartburn).     [provider]  ferrous sulfate 325 (65 FE) MG tablet Take 325 mg by mouth daily with breakfast.    [provider]  fluticasone (FLONASE) 50 MCG/ACT nasal spray Place 2 sprays into both nostrils daily. 07/09/19   Dennard Nip D, MD  ibuprofen (ADVIL) 800 MG tablet Take 1 tablet (800 mg total) by mouth every 6 (six) hours as needed for moderate pain. 09/19/19   Servando Salina, MD  metFORMIN (GLUCOPHAGE-XR) 500 MG 24 hr tablet Take 1 tablet by mouth once a day with food for blood sugar 01/19/21     nitroGLYCERIN (NITRO-DUR) 0.2 mg/hr patch Apply 1/4 patch to skin once a day as needed for pain 04/11/21     ondansetron (ZOFRAN) 4 MG tablet Take 1 tablet by  mouth every 8 hours as needed for nausea or vomiting. 09/09/20   Paretta-Leahey, Haze Boyden, NP  oxyCODONE (OXY IR/ROXICODONE) 5 MG immediate release tablet Take 1-2 tablets (5-10 mg total) by mouth every 4 (four) hours as needed for moderate pain. 09/19/19   Servando Salina, MD  prednisoLONE acetate (PRED FORTE) 1 % ophthalmic suspension INSTILL 1 DROP IN THE LEFT EYE 3 TIMES DAILY FOR 3 WEEKS AS DIRECTED 07/16/20 07/16/21  Delmonte, Antionette Fairy, MD  predniSONE (DELTASONE) 20 MG tablet Taper as directed each morning: 3 tabs daily for 3 days, 2 tabs daily for 3 days, then 1 tab daily for 3 days. 03/17/21     spironolactone (ALDACTONE) 25 MG tablet TAKE 1 TABLET BY  MOUTH ONCE DAILY FOR BLOOD PRESSURE 01/20/20 02/15/21  Gaynelle Arabian, MD  spironolactone (ALDACTONE) 25 MG tablet Take 1 tablet by mouth once a day for blood pressure 01/19/21     spironolactone (ALDACTONE) 50 MG tablet Take 50 mg by mouth daily.    [provider]  Vitamin D, Ergocalciferol, (DRISDOL) 1.25 MG (50000 UNIT) CAPS capsule Take 1 capsule (50,000 Units total) by mouth every 7 (seven) days. 07/09/19   Starlyn Skeans, MD      Allergies    Patient has no known allergies.    Review of Systems   Review of Systems  Constitutional:  Negative for activity change, chills and fever.  Respiratory:  Positive for shortness of breath.   Cardiovascular:  Negative for chest pain, palpitations and leg swelling.  Gastrointestinal:  Positive for abdominal pain, nausea and vomiting. Negative for abdominal distention.  Genitourinary:  Negative for dysuria.  Neurological:  Negative for weakness and light-headedness.  All other systems reviewed and are negative.  Physical Exam Updated Vital Signs BP (!) 165/92 (BP Location: Right Arm)    Pulse 81    Temp 98.4 F (36.9 C) (Oral)    Resp 20    Ht _0  (1.6 m)    Wt 102.1 kg    SpO2 100%    BMI 39.86 kg/m  Physical Exam Vitals and nursing note reviewed.  Constitutional:      General: She is not in acute distress.    Appearance: Normal appearance. She is not ill-appearing.  HENT:     Head: Normocephalic and atraumatic.     Nose: Nose normal.  Eyes:     General: No scleral icterus.    Extraocular Movements: Extraocular movements intact.     Conjunctiva/sclera: Conjunctivae normal.  Cardiovascular:     Rate and Rhythm: Normal rate and regular rhythm.     Pulses: Normal pulses.     Heart sounds: Normal heart sounds.  Pulmonary:     Effort: Pulmonary effort is normal. No respiratory distress.     Breath sounds: Normal breath sounds. No wheezing or rales.  Abdominal:     General: There is no distension.     Tenderness: There is  abdominal tenderness. There is no right CVA tenderness or left CVA tenderness.  Musculoskeletal:        General: Normal range of motion.     Cervical back: Normal range of motion.     Right lower leg: No edema.     Left lower leg: No edema.  Skin:    General: Skin is warm and dry.  Neurological:     General: No focal deficit present.     Mental Status: She is alert. Mental status is at baseline.    ED Results /  Procedures / Treatments   Labs (all labs ordered are listed, but only abnormal results are displayed) Labs Reviewed  CBC - Abnormal; Notable for the following components:      Result Value   WBC 11.7 (*)    MCV 78.2 (*)    MCH 25.4 (*)    All other components within normal limits  LIPASE, BLOOD  COMPREHENSIVE METABOLIC PANEL  URINALYSIS, ROUTINE W REFLEX MICROSCOPIC  CBC WITH DIFFERENTIAL/PLATELET  TROPONIN I (HIGH SENSITIVITY)    EKG EKG Interpretation  Date/Time:  Sunday May 29 2021 18:28:36 EST Ventricular Rate:  70 PR Interval:  167 QRS Duration: 66 QT Interval:  401 QTC Calculation: 433 R Axis:   37 Text Interpretation: Sinus rhythm RAE, consider biatrial enlargement No significant change since last tracing Confirmed by Wandra Arthurs [phone]) on 05/29/2021 6:57:06 PM  Radiology No results found.  Procedures Procedures    Medications Ordered in ED Medications  ondansetron (ZOFRAN) injection 4 mg (has no administration in time range)  morphine 4 MG/ML injection 4 mg (4 mg Intravenous Given 05/29/21 1904)  ondansetron (ZOFRAN) injection 4 mg (4 mg Intravenous Given 05/29/21 1904)    ED Course/ Medical Decision Making/ A&P                           Medical Decision Making Amount and/or Complexity of Data Reviewed Labs: ordered. Radiology: ordered.  Risk Prescription drug management.   Medical Decision Making / ED Course   This patient presents to the ED for concern of abdominal pain, nausea, vomiting, dark stools, this involves an  extensive number of treatment options, and is a complaint that carries with it a high risk of complications and morbidity.  The differential diagnosis includes GI bleed, pancreatitis, gastric ulcer, cholecystitis, other acute intra-abdominal process, ACS, pneumonia, viral gastroenteritis  MDM: 61 year old female presents today for evaluation of abdominal pain, nausea, vomiting since 1 AM last night.  She also endorses dark stools.  Denies lightheadedness, she is without tachycardia, or other signs or symptoms concerning for acute symptomatic anemia.  Epigastric tenderness to palpation present on exam.  Initial work-up significant for mild leukocytosis of 11.6 consistent with reactive given no additional signs or symptoms of infection.  CT scan without acute intra-abdominal process.  Rectal exam without hemorrhoids and guaiac negative stools.  Lipase within normal limits.  CMP with glucose of 142, otherwise without acute findings.  CBC without anemia.  Doubt patient has GI bleed given stable hemoglobin and guaiac negative.  Abdomen benign on reevaluation.  Troponin initially elevated to 22 on repeat 11.  Unlikely ACS likely demand from vomiting specially given downtrend on repeat..  Patient also had complete resolution of abdominal pain with morphine.  Did not have an episode of emesis while in the emergency room and tolerated p.o. intake without difficulty.  Patient provided with Zofran, Bentyl, Percocet for symptomatic management.  Encouraged to follow-up with primary care provider.  Return precautions discussed with patient at length.  EKG without acute ischemic changes.  Chest x-ray without acute cardial pulm process.  Patient voices understanding and is in agreement with plan.  I discussed this case with my attending physician who cosigned this note including patient's presenting symptoms, physical exam, and planned diagnostics and interventions. Attending physician stated agreement with plan or made  changes to plan which were implemented.   Lab Tests: -I ordered, reviewed, and interpreted labs.   The pertinent results include:   Labs Reviewed  CBC - Abnormal; Notable for the following components:      Result Value   WBC 11.7 (*)    MCV 78.2 (*)    MCH 25.4 (*)    All other components within normal limits  LIPASE, BLOOD  COMPREHENSIVE METABOLIC PANEL  URINALYSIS, ROUTINE W REFLEX MICROSCOPIC  CBC WITH DIFFERENTIAL/PLATELET  TROPONIN I (HIGH SENSITIVITY)      EKG  EKG Interpretation  Date/Time:  _0 /30/23 0001              Evlyn Courier, PA-C 05/30/21 0008    Drenda Freeze, MD 05/31/21 [phone]

## 2021-05-29 NOTE — ED Triage Notes (Signed)
Patient presented to the ED from home with c/o nausea and vomiting. Patient report she had dark emesis and dark stool started at 1 am today. Pt c/o upper epigastric pain 8/10.

## 2021-05-30 ENCOUNTER — Other Ambulatory Visit (HOSPITAL_COMMUNITY): Payer: Self-pay

## 2021-05-30 DIAGNOSIS — Z79899 Other long term (current) drug therapy: Secondary | ICD-10-CM | POA: Diagnosis not present

## 2021-05-30 DIAGNOSIS — D72829 Elevated white blood cell count, unspecified: Secondary | ICD-10-CM | POA: Diagnosis not present

## 2021-05-30 DIAGNOSIS — R1013 Epigastric pain: Secondary | ICD-10-CM | POA: Diagnosis not present

## 2021-05-30 DIAGNOSIS — I10 Essential (primary) hypertension: Secondary | ICD-10-CM | POA: Diagnosis not present

## 2021-05-30 DIAGNOSIS — R112 Nausea with vomiting, unspecified: Secondary | ICD-10-CM | POA: Diagnosis not present

## 2021-05-30 DIAGNOSIS — Z7984 Long term (current) use of oral hypoglycemic drugs: Secondary | ICD-10-CM | POA: Diagnosis not present

## 2021-05-30 DIAGNOSIS — R7309 Other abnormal glucose: Secondary | ICD-10-CM | POA: Diagnosis not present

## 2021-05-30 DIAGNOSIS — R0602 Shortness of breath: Secondary | ICD-10-CM | POA: Diagnosis not present

## 2021-05-30 MED ORDER — ONDANSETRON 4 MG PO TBDP
4.0000 mg | ORAL_TABLET | Freq: Three times a day (TID) | ORAL | 0 refills | Status: DC | PRN
  Filled 2021-05-30: qty 20, 7d supply, fill #0

## 2021-05-30 MED ORDER — OXYCODONE-ACETAMINOPHEN 5-325 MG PO TABS
1.0000 | ORAL_TABLET | Freq: Four times a day (QID) | ORAL | 0 refills | Status: DC | PRN
  Filled 2021-05-30: qty 8, 2d supply, fill #0

## 2021-05-30 MED ORDER — DICYCLOMINE HCL 20 MG PO TABS
20.0000 mg | ORAL_TABLET | Freq: Two times a day (BID) | ORAL | 0 refills | Status: DC
  Filled 2021-05-30: qty 20, 10d supply, fill #0

## 2021-06-02 ENCOUNTER — Other Ambulatory Visit: Payer: Self-pay | Admitting: Family Medicine

## 2021-06-02 DIAGNOSIS — R1013 Epigastric pain: Secondary | ICD-10-CM

## 2021-06-03 ENCOUNTER — Other Ambulatory Visit (HOSPITAL_COMMUNITY): Payer: Self-pay

## 2021-06-03 MED ORDER — DEXLANSOPRAZOLE 60 MG PO CPDR
DELAYED_RELEASE_CAPSULE | ORAL | 1 refills | Status: DC
  Filled 2021-06-03: qty 90, 90d supply, fill #0

## 2021-06-08 ENCOUNTER — Other Ambulatory Visit: Payer: Self-pay

## 2021-06-08 ENCOUNTER — Ambulatory Visit
Admission: RE | Admit: 2021-06-08 | Discharge: 2021-06-08 | Disposition: A | Discharge status: Home / Self Care | Payer: 59 | Source: Ambulatory Visit | Attending: Family Medicine | Admitting: Family Medicine

## 2021-06-08 DIAGNOSIS — R1013 Epigastric pain: Secondary | ICD-10-CM

## 2021-06-08 DIAGNOSIS — R1010 Upper abdominal pain, unspecified: Secondary | ICD-10-CM | POA: Diagnosis not present

## 2021-06-09 ENCOUNTER — Other Ambulatory Visit (HOSPITAL_COMMUNITY): Payer: Self-pay

## 2021-06-09 MED ORDER — ESOMEPRAZOLE MAGNESIUM 40 MG PO CPDR
DELAYED_RELEASE_CAPSULE | ORAL | 3 refills | Status: DC
  Filled 2021-06-09: qty 30, 30d supply, fill #0
  Filled 2021-10-17: qty 30, 30d supply, fill #1
  Filled 2022-01-25: qty 30, 30d supply, fill #2

## 2021-06-10 ENCOUNTER — Other Ambulatory Visit (HOSPITAL_COMMUNITY): Payer: Self-pay

## 2021-06-20 DIAGNOSIS — M25572 Pain in left ankle and joints of left foot: Secondary | ICD-10-CM | POA: Diagnosis not present

## 2021-06-21 DIAGNOSIS — D649 Anemia, unspecified: Secondary | ICD-10-CM | POA: Diagnosis not present

## 2021-06-21 DIAGNOSIS — R7303 Prediabetes: Secondary | ICD-10-CM | POA: Diagnosis not present

## 2021-06-21 DIAGNOSIS — R1013 Epigastric pain: Secondary | ICD-10-CM | POA: Diagnosis not present

## 2021-07-07 ENCOUNTER — Other Ambulatory Visit (HOSPITAL_COMMUNITY): Payer: Self-pay

## 2021-07-08 ENCOUNTER — Other Ambulatory Visit (HOSPITAL_COMMUNITY): Payer: Self-pay

## 2021-07-26 ENCOUNTER — Other Ambulatory Visit (HOSPITAL_COMMUNITY): Payer: Self-pay

## 2021-08-01 DIAGNOSIS — M25572 Pain in left ankle and joints of left foot: Secondary | ICD-10-CM | POA: Diagnosis not present

## 2021-08-04 ENCOUNTER — Other Ambulatory Visit (HOSPITAL_COMMUNITY): Payer: Self-pay

## 2021-09-14 ENCOUNTER — Other Ambulatory Visit (HOSPITAL_COMMUNITY): Payer: Self-pay

## 2021-10-17 ENCOUNTER — Other Ambulatory Visit (HOSPITAL_COMMUNITY): Payer: Self-pay

## 2021-10-18 DIAGNOSIS — D649 Anemia, unspecified: Secondary | ICD-10-CM | POA: Diagnosis not present

## 2021-10-18 DIAGNOSIS — R7303 Prediabetes: Secondary | ICD-10-CM | POA: Diagnosis not present

## 2021-10-28 ENCOUNTER — Other Ambulatory Visit (HOSPITAL_COMMUNITY): Payer: Self-pay

## 2021-11-11 ENCOUNTER — Other Ambulatory Visit (HOSPITAL_COMMUNITY): Payer: Self-pay

## 2021-12-06 ENCOUNTER — Telehealth: Payer: Self-pay | Admitting: Family

## 2021-12-06 ENCOUNTER — Other Ambulatory Visit (HOSPITAL_COMMUNITY): Payer: Self-pay

## 2021-12-06 MED ORDER — TRIAMCINOLONE ACETONIDE 0.1 % EX OINT
1.0000 | TOPICAL_OINTMENT | Freq: Two times a day (BID) | CUTANEOUS | 2 refills | Status: DC
  Filled 2021-12-06: qty 80, 40d supply, fill #0
  Filled 2022-04-20: qty 80, 40d supply, fill #1
  Filled 2022-05-25: qty 80, 40d supply, fill #2

## 2021-12-06 NOTE — Telephone Encounter (Signed)
Triamcinolone ointment 0.1% applied twice daily, 80 gram with 2 RF's.RX for above e-scribed and sent to pharmacy on record  [place] [place] [address] Phone: [phone] Fax: [phone]

## 2021-12-07 ENCOUNTER — Encounter (INDEPENDENT_AMBULATORY_CARE_PROVIDER_SITE_OTHER): Payer: Self-pay

## 2021-12-20 DIAGNOSIS — K625 Hemorrhage of anus and rectum: Secondary | ICD-10-CM | POA: Diagnosis not present

## 2021-12-26 ENCOUNTER — Other Ambulatory Visit (HOSPITAL_COMMUNITY): Payer: Self-pay

## 2021-12-26 DIAGNOSIS — Z1231 Encounter for screening mammogram for malignant neoplasm of breast: Secondary | ICD-10-CM | POA: Diagnosis not present

## 2022-01-10 ENCOUNTER — Other Ambulatory Visit (HOSPITAL_COMMUNITY): Payer: Self-pay

## 2022-01-19 ENCOUNTER — Other Ambulatory Visit (HOSPITAL_COMMUNITY): Payer: Self-pay

## 2022-01-25 ENCOUNTER — Other Ambulatory Visit (HOSPITAL_COMMUNITY): Payer: Self-pay

## 2022-01-25 MED ORDER — ATORVASTATIN CALCIUM 10 MG PO TABS
10.0000 mg | ORAL_TABLET | Freq: Every day | ORAL | 0 refills | Status: DC
  Filled 2022-01-25: qty 90, 90d supply, fill #0

## 2022-01-25 MED ORDER — SPIRONOLACTONE 25 MG PO TABS
25.0000 mg | ORAL_TABLET | Freq: Every day | ORAL | 0 refills | Status: DC
  Filled 2022-01-25: qty 90, 90d supply, fill #0

## 2022-01-28 ENCOUNTER — Other Ambulatory Visit (HOSPITAL_COMMUNITY): Payer: Self-pay

## 2022-01-28 ENCOUNTER — Encounter (HOSPITAL_COMMUNITY): Payer: Self-pay | Admitting: Pharmacist

## 2022-02-20 ENCOUNTER — Other Ambulatory Visit (HOSPITAL_COMMUNITY): Payer: Self-pay

## 2022-02-20 DIAGNOSIS — J309 Allergic rhinitis, unspecified: Secondary | ICD-10-CM | POA: Diagnosis not present

## 2022-02-20 DIAGNOSIS — E7800 Pure hypercholesterolemia, unspecified: Secondary | ICD-10-CM | POA: Diagnosis not present

## 2022-02-20 DIAGNOSIS — Z0000 Encounter for general adult medical examination without abnormal findings: Secondary | ICD-10-CM | POA: Diagnosis not present

## 2022-02-20 DIAGNOSIS — R7303 Prediabetes: Secondary | ICD-10-CM | POA: Diagnosis not present

## 2022-02-20 DIAGNOSIS — K219 Gastro-esophageal reflux disease without esophagitis: Secondary | ICD-10-CM | POA: Diagnosis not present

## 2022-02-20 DIAGNOSIS — E559 Vitamin D deficiency, unspecified: Secondary | ICD-10-CM | POA: Diagnosis not present

## 2022-02-20 DIAGNOSIS — M2550 Pain in unspecified joint: Secondary | ICD-10-CM | POA: Diagnosis not present

## 2022-02-20 DIAGNOSIS — I10 Essential (primary) hypertension: Secondary | ICD-10-CM | POA: Diagnosis not present

## 2022-02-20 DIAGNOSIS — M170 Bilateral primary osteoarthritis of knee: Secondary | ICD-10-CM | POA: Diagnosis not present

## 2022-02-20 MED ORDER — SPIRONOLACTONE 25 MG PO TABS
25.0000 mg | ORAL_TABLET | Freq: Every day | ORAL | 4 refills | Status: DC
  Filled 2022-04-20 – 2022-08-03 (×2): qty 90, 90d supply, fill #0
  Filled 2022-10-30: qty 90, 90d supply, fill #1
  Filled 2023-02-12: qty 90, 90d supply, fill #2

## 2022-02-20 MED ORDER — ATENOLOL 50 MG PO TABS
50.0000 mg | ORAL_TABLET | Freq: Every day | ORAL | 4 refills | Status: DC
  Filled 2022-02-20 – 2022-08-03 (×3): qty 90, 90d supply, fill #0
  Filled 2022-10-30: qty 90, 90d supply, fill #1
  Filled 2023-02-12: qty 90, 90d supply, fill #2

## 2022-02-20 MED ORDER — ATORVASTATIN CALCIUM 10 MG PO TABS
10.0000 mg | ORAL_TABLET | Freq: Every day | ORAL | 4 refills | Status: DC
  Filled 2022-04-20 – 2022-08-03 (×2): qty 90, 90d supply, fill #0
  Filled 2022-10-30: qty 90, 90d supply, fill #1
  Filled 2023-02-12: qty 90, 90d supply, fill #2

## 2022-02-24 ENCOUNTER — Other Ambulatory Visit (HOSPITAL_COMMUNITY): Payer: Self-pay

## 2022-02-24 ENCOUNTER — Ambulatory Visit (INDEPENDENT_AMBULATORY_CARE_PROVIDER_SITE_OTHER): Payer: 59

## 2022-02-24 ENCOUNTER — Ambulatory Visit
Admission: EM | Admit: 2022-02-24 | Discharge: 2022-02-24 | Disposition: A | Discharge status: Home / Self Care | Payer: 59 | Attending: Urgent Care | Admitting: Urgent Care

## 2022-02-24 DIAGNOSIS — M25562 Pain in left knee: Secondary | ICD-10-CM

## 2022-02-24 DIAGNOSIS — M25462 Effusion, left knee: Secondary | ICD-10-CM | POA: Diagnosis not present

## 2022-02-24 DIAGNOSIS — G8929 Other chronic pain: Secondary | ICD-10-CM

## 2022-02-24 DIAGNOSIS — M1712 Unilateral primary osteoarthritis, left knee: Secondary | ICD-10-CM | POA: Diagnosis not present

## 2022-02-24 MED ORDER — PREDNISONE 50 MG PO TABS
50.0000 mg | ORAL_TABLET | Freq: Every day | ORAL | 0 refills | Status: AC
  Filled 2022-02-24: qty 5, 5d supply, fill #0

## 2022-02-24 NOTE — ED Provider Notes (Signed)
[facility]  Note:  This document was prepared using Systems analyst and may include unintentional dictation errors.  MRN: [id number] DOB: 02/25/1961  Subjective:   Leslie [name] is a 61 y.o. female presenting for 61-monthhistory of acute onset left knee pain with swelling worsening in the past 2 weeks.  Patient feels the pain is deep and penetrating, internal.  Has strong family history of arthritis.  Has been managed for the same in the right knee.  No fall, trauma, history of gout.  Patient was a prediabetic but is not taking metformin anymore as her blood sugar is well controlled.  No current facility-administered medications for this encounter.  Current Outpatient Medications:    albuterol (VENTOLIN HFA) 108 (90 Base) MCG/ACT inhaler, Inhale 2 puffs by mouth into the lungs every 4 hours as needed (Patient taking differently: Inhale 2 puffs into the lungs every 4 (four) hours as needed for wheezing or shortness of breath.), Disp: 18 g, Rfl: 1   atenolol (TENORMIN) 50 MG tablet, Take 1 tablet (50 mg total) by mouth daily for blood pressure, Disp: 90 tablet, Rfl: 4   atorvastatin (LIPITOR) 10 MG tablet, Take 1 tablet (10 mg total) by mouth daily for cholesterol, Disp: 90 tablet, Rfl: 4   cetirizine (ZYRTEC) 10 MG tablet, Take 1 tablet (10 mg total) by mouth as needed for allergies. (Patient taking differently: Take 10 mg by mouth daily.), Disp: 30 tablet, Rfl: 0   COVID-19 At Home Antigen Test ([facility] COVID-19 HOME TEST) KIT, Use as directed within package instructions, Disp: 4 each, Rfl: 0   fluticasone (FLONASE) 50 MCG/ACT nasal spray, Place 2 sprays into both nostrils daily., Disp: 16 g, Rfl: 6   naproxen sodium (ALEVE) 220 MG tablet, Take 440-660 mg by mouth daily as needed (pain)., Disp: , Rfl:    nitroGLYCERIN (NITRO-DUR) 0.2 mg/hr patch, Apply 1/4 patch to skin once a day as needed for pain (Patient taking differently: Place 0.2 mg onto  the skin daily as needed (pain).), Disp: 10 patch, Rfl: 0   Soft Lens Products (REFRESH CONTACTS DROPS) SOLN, Place 1 drop into both eyes 2 (two) times daily as needed (redness)., Disp: , Rfl:    spironolactone (ALDACTONE) 25 MG tablet, Take 1 tablet (25 mg total) by mouth daily for blood pressure, Disp: 90 tablet, Rfl: 4   triamcinolone ointment (KENALOG) 0.1 %, Apply topically 2 (two) times daily., Disp: 80 g, Rfl: 2   Vitamin D, Ergocalciferol, (DRISDOL) 1.25 MG (50000 UNIT) CAPS capsule, Take 1 capsule (50,000 Units total) by mouth every 7 (seven) days., Disp: 4 capsule, Rfl: 0   Allergies  Allergen Reactions   Amlodipine Besylate     Other reaction(s): edema (03/2018)   Chlorthalidone     Other reaction(s): hypokalemia (2019)   Lisinopril     Other reaction(s): Cough (10/09)   Montelukast Sodium Rash    Past Medical History:  Diagnosis Date   Allergy    Anemia    Arthritis    Dyspnea    exertion   GERD (gastroesophageal reflux disease)    Heart murmur    Hyperlipidemia    Hypertension    Joint pain    Lower extremity edema    Pre-diabetes    Prediabetes    Sickle cell trait ([facility]      Past Surgical History:  Procedure Laterality Date   cyst on back     benign   cyst on neck  GANGLION CYST EXCISION     left hand   ROBOTIC ASSISTED LAPAROSCOPIC HYSTERECTOMY AND SALPINGECTOMY Bilateral 09/18/2019   Procedure: XI ROBOTIC ASSISTED LAPAROSCOPIC HYSTERECTOMY AND SALPINGECTOMY;  Surgeon: Servando Salina, MD;  Location: [facility];  Service: Gynecology;  Laterality: Bilateral;  Tracie RNFA confirmed on 09/11/19 CS   WRIST SURGERY      Family History  Problem Relation Age of Onset   Breast cancer Mother    Hypertension Mother    Heart disease Mother    Sleep apnea Mother     Social History   Tobacco Use   Smoking status: Never   Smokeless tobacco: Never  Vaping Use   Vaping Use: Never used  Substance Use Topics   Alcohol use: Yes     Comment: daily   Drug use: No    ROS   Objective:   Vitals: BP (!) 146/89 (BP Location: Right Arm)   Pulse 62   Temp 97.8 F (36.6 C) (Oral)   Resp 18   LMP 09/07/2019   SpO2 94%   Physical Exam Constitutional:      General: She is not in acute distress.    Appearance: Normal appearance. She is well-developed. She is obese. She is not ill-appearing, toxic-appearing or diaphoretic.  HENT:     Head: Normocephalic and atraumatic.     Nose: Nose normal.     Mouth/Throat:     Mouth: Mucous membranes are moist.  Eyes:     General: No scleral icterus.       Right eye: No discharge.        Left eye: No discharge.     Extraocular Movements: Extraocular movements intact.  Cardiovascular:     Rate and Rhythm: Normal rate.  Pulmonary:     Effort: Pulmonary effort is normal.  Musculoskeletal:     Left knee: Swelling and bony tenderness present. No deformity, effusion, erythema, ecchymosis, lacerations or crepitus. Decreased range of motion. Tenderness present over the medial joint line and patellar tendon. No lateral joint line tenderness. Normal alignment and normal patellar mobility.  Skin:    General: Skin is warm and dry.  Neurological:     General: No focal deficit present.     Mental Status: She is alert and oriented to person, place, and time.  Psychiatric:        Mood and Affect: Mood normal.        Behavior: Behavior normal.    DG Knee Complete 4 Views Left  Result Date: 02/24/2022 CLINICAL DATA:  Chronic left knee pain.  No known injury. EXAM: LEFT KNEE - COMPLETE 4+ VIEW COMPARISON:  None Available. FINDINGS: No fracture or dislocation is noted. Small suprapatellar joint effusion is noted. Minimal narrowing of medial joint space is noted. Mild narrowing of patellofemoral space is noted with osteophyte formation. IMPRESSION: Mild degenerative joint disease. Small suprapatellar joint effusion. No fracture or dislocation. Electronically Signed   By: Marijo Conception M.D.    On: 02/24/2022 09:41    Assessment and Plan :   PDMP not reviewed this encounter.  1. Acute pain of left knee   2. Osteoarthritis of left knee, unspecified osteoarthritis type   3. Knee effusion, left     Patient has previously been seen at [facility].  Recommended that she set up a follow-up for consideration of further imaging including an MRI.  For her current episode, offered an oral prednisone course and patient was agreeable. Counseled patient on potential for adverse effects  with medications prescribed/recommended today, ER and return-to-clinic precautions discussed, patient verbalized understanding.    Jaynee Eagles, PA-C 02/24/22 1009

## 2022-02-24 NOTE — ED Triage Notes (Signed)
C/O pain in her left knee that has been ongoing for 2 months. Denies injury.

## 2022-02-27 DIAGNOSIS — M25562 Pain in left knee: Secondary | ICD-10-CM | POA: Diagnosis not present

## 2022-03-01 DIAGNOSIS — H524 Presbyopia: Secondary | ICD-10-CM | POA: Diagnosis not present

## 2022-04-07 ENCOUNTER — Other Ambulatory Visit (HOSPITAL_COMMUNITY): Payer: Self-pay

## 2022-04-11 ENCOUNTER — Other Ambulatory Visit (HOSPITAL_COMMUNITY): Payer: Self-pay

## 2022-04-20 ENCOUNTER — Other Ambulatory Visit: Payer: Self-pay

## 2022-04-20 ENCOUNTER — Other Ambulatory Visit (HOSPITAL_COMMUNITY): Payer: Self-pay

## 2022-04-20 ENCOUNTER — Other Ambulatory Visit (INDEPENDENT_AMBULATORY_CARE_PROVIDER_SITE_OTHER): Payer: Self-pay | Admitting: Family Medicine

## 2022-04-20 DIAGNOSIS — J309 Allergic rhinitis, unspecified: Secondary | ICD-10-CM

## 2022-05-09 IMAGING — CT CT ABD-PELV W/ CM
2 of 5 series · 16 of 46 positions shown, 18 images · IV contrast (agent unspecified)
Comparison: MRI abdomen dated 03/10/2017

CLINICAL DATA: Abdominal pain, nausea/vomiting

EXAM:
CT ABDOMEN AND PELVIS WITH CONTRAST
TECHNIQUE: Multidetector CT imaging of the abdomen and pelvis was performed
using the standard protocol following bolus administration of
intravenous contrast.

[Series 2: axial st · axial · 0.77mm/px · z∈[+1103,+1493]mm · 13 of 90 slices shown, 15 images]
[im 6/90  soft-tissue]
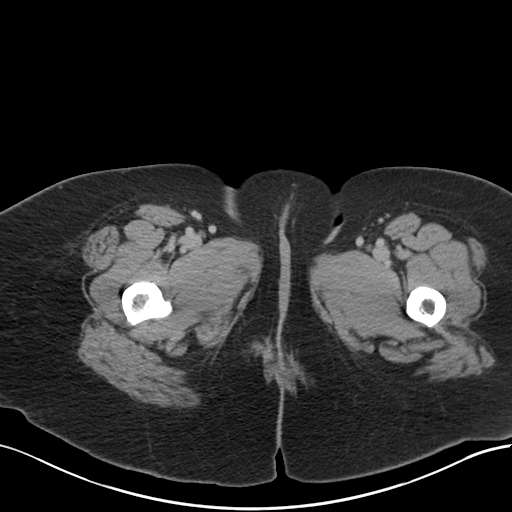
[im 6/90  bone]
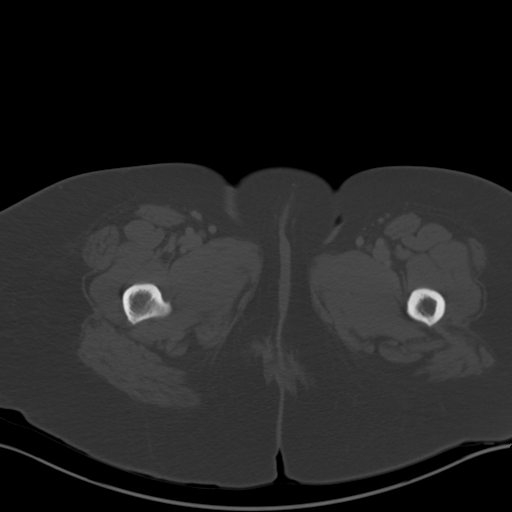
[im 12/90  soft-tissue]
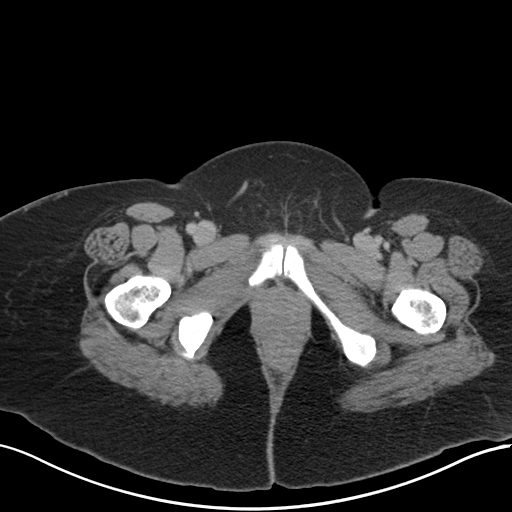
[im 18/90  soft-tissue]
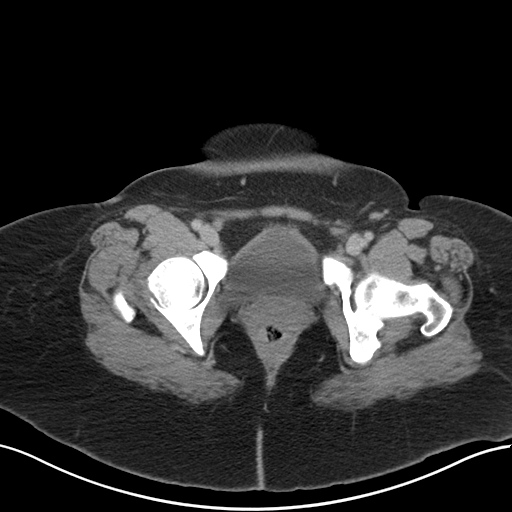
[im 24/90  soft-tissue]
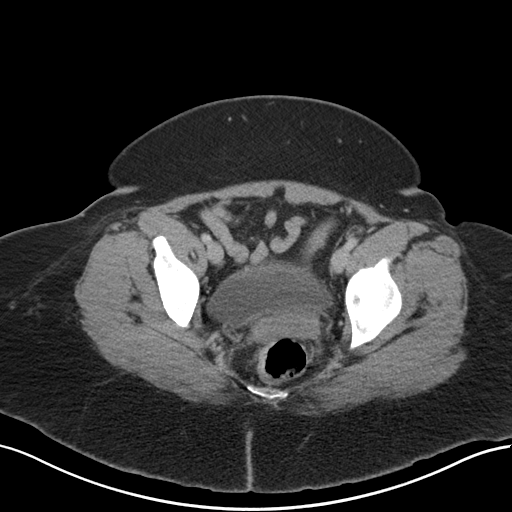
[im 30/90  soft-tissue]
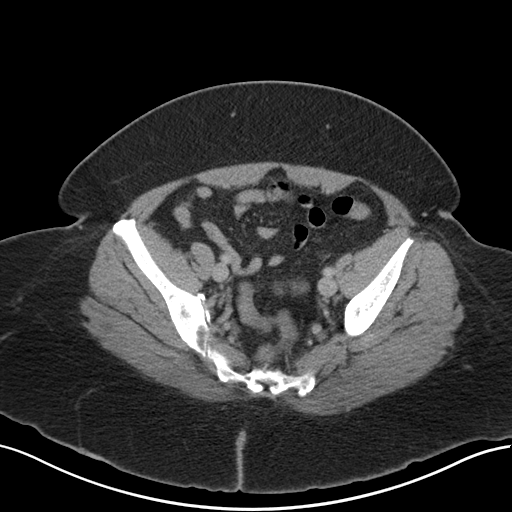
[im 36/90  soft-tissue]
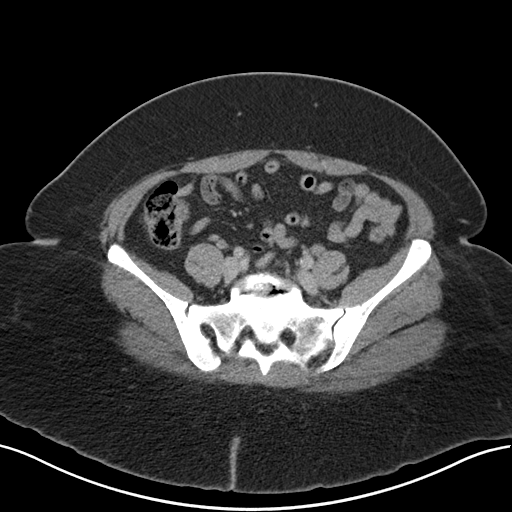
[im 48/90  soft-tissue]
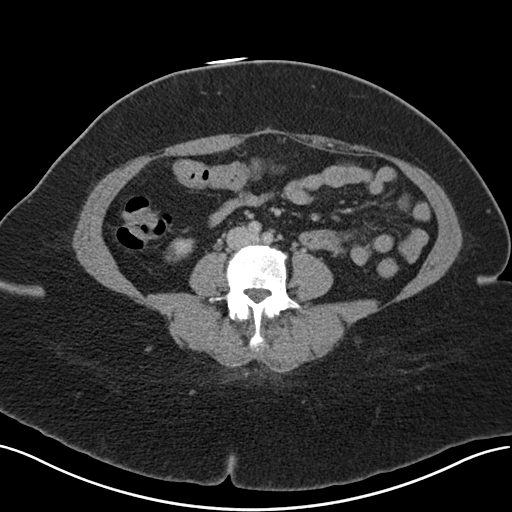
[im 54/90  soft-tissue]
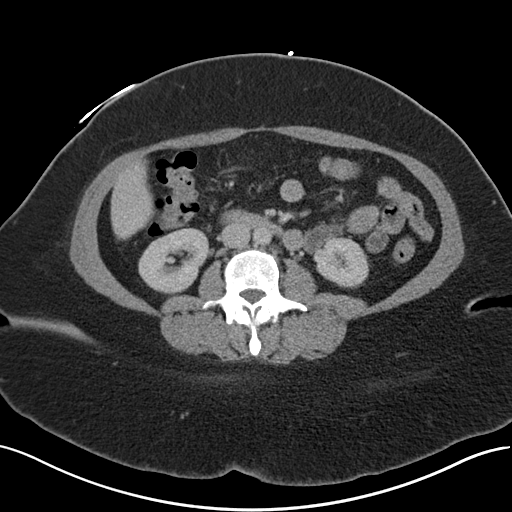
[im 60/90  soft-tissue]
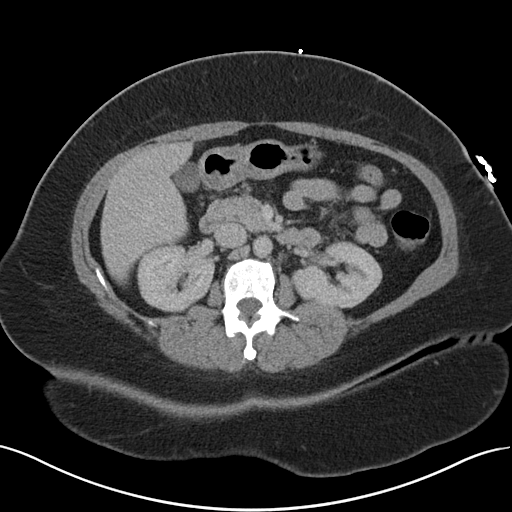
[im 60/90  bone]
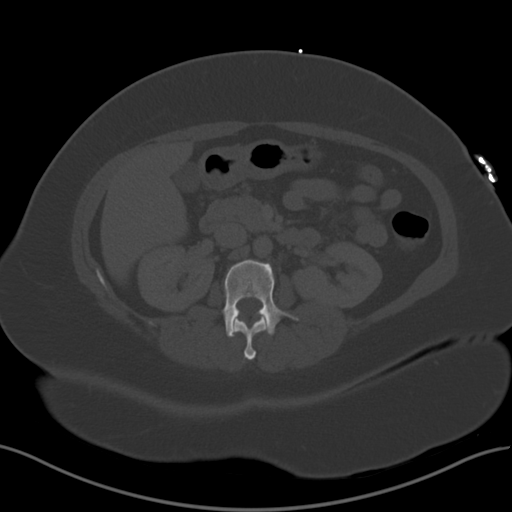
[im 66/90  soft-tissue]
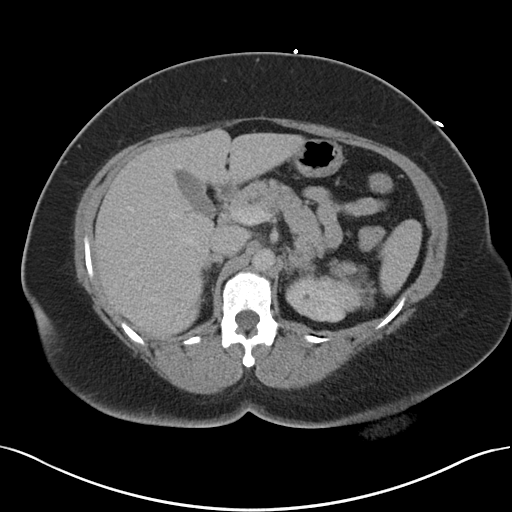
[im 72/90  soft-tissue]
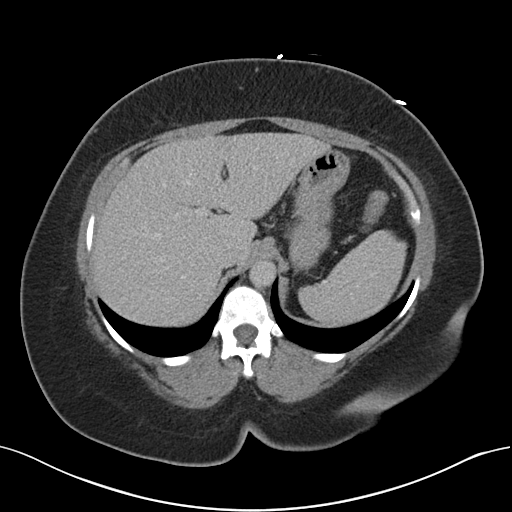
[im 78/90  soft-tissue]
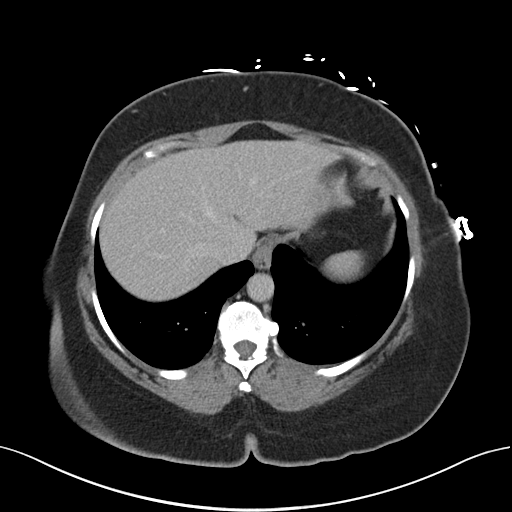
[im 84/90  soft-tissue]
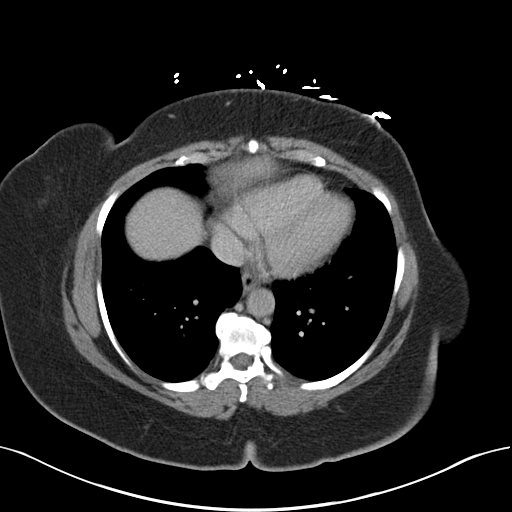

[Series 4: coronal st · coronal · 0.77mm/px · 3 of 151 slices shown]
[im 51/151  soft-tissue]
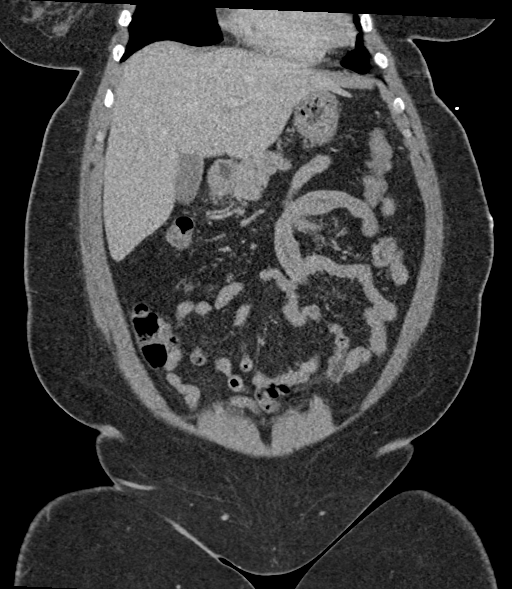
[im 67/151  soft-tissue]
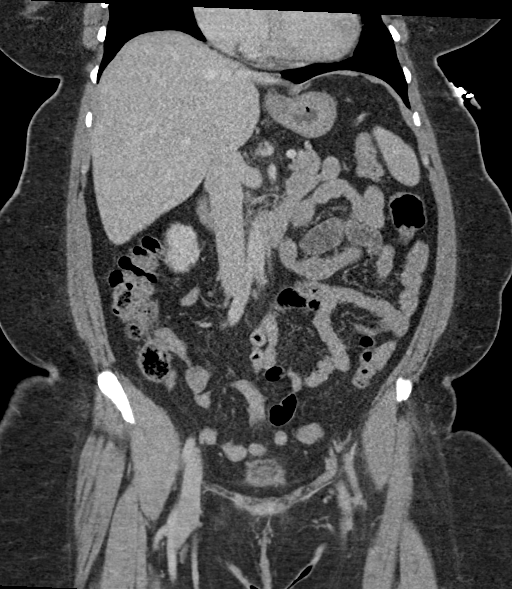
[im 84/151  soft-tissue]
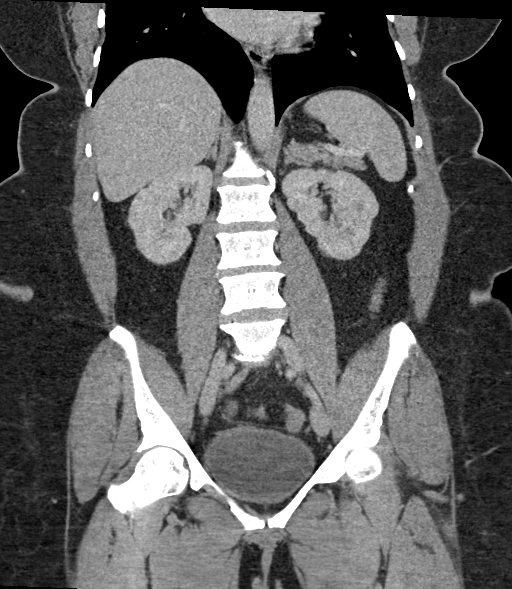

[16 of 46 positions shown; findings below may reference images not displayed]

RADIATION DOSE REDUCTION: This exam was performed according to the
departmental dose-optimization program which includes automated
exposure control, adjustment of the mA and/or kV according to
patient size and/or use of iterative reconstruction technique.

CONTRAST:  100mL OMNIPAQUE IOHEXOL 300 MG/ML  SOLN
FINDINGS: Lower chest: Lung bases are clear.

Hepatobiliary: Liver is within normal limits.

Gallbladder is unremarkable. No intrahepatic or extrahepatic duct
dilatation.

Pancreas: Within normal limits.

Spleen: Within normal limits.

Adrenals/Urinary Tract: Adrenal glands are within normal limits.

Subcentimeter cyst in the medial left upper kidney (series 2/image
28). Right kidney is within normal limits. No hydronephrosis.

Bladder is within normal limits.

Stomach/Bowel: Stomach is within normal limits.

No evidence of bowel obstruction.

Normal appendix (series 2/image 83).

Vascular/Lymphatic: No evidence of abdominal aortic aneurysm.

No suspicious abdominopelvic lymphadenopathy.

Reproductive: Status post hysterectomy.

Bilateral ovaries are within normal limits.

Other: No abdominopelvic ascites.

Musculoskeletal: Mild degenerative changes of the lower thoracic
spine and at L5-S1.
IMPRESSION: No evidence of bowel obstruction.  Normal appendix.

No CT findings to account for the patient's abdominal pain.

## 2022-05-09 IMAGING — CR DG CHEST 1V PORT
1 series · 1 of 1 positions shown · non-contrast
Comparison: 11/22/2020

CLINICAL DATA: Abdominal pain and dyspnea

EXAM:
PORTABLE CHEST 1 VIEW

[x chest ap]
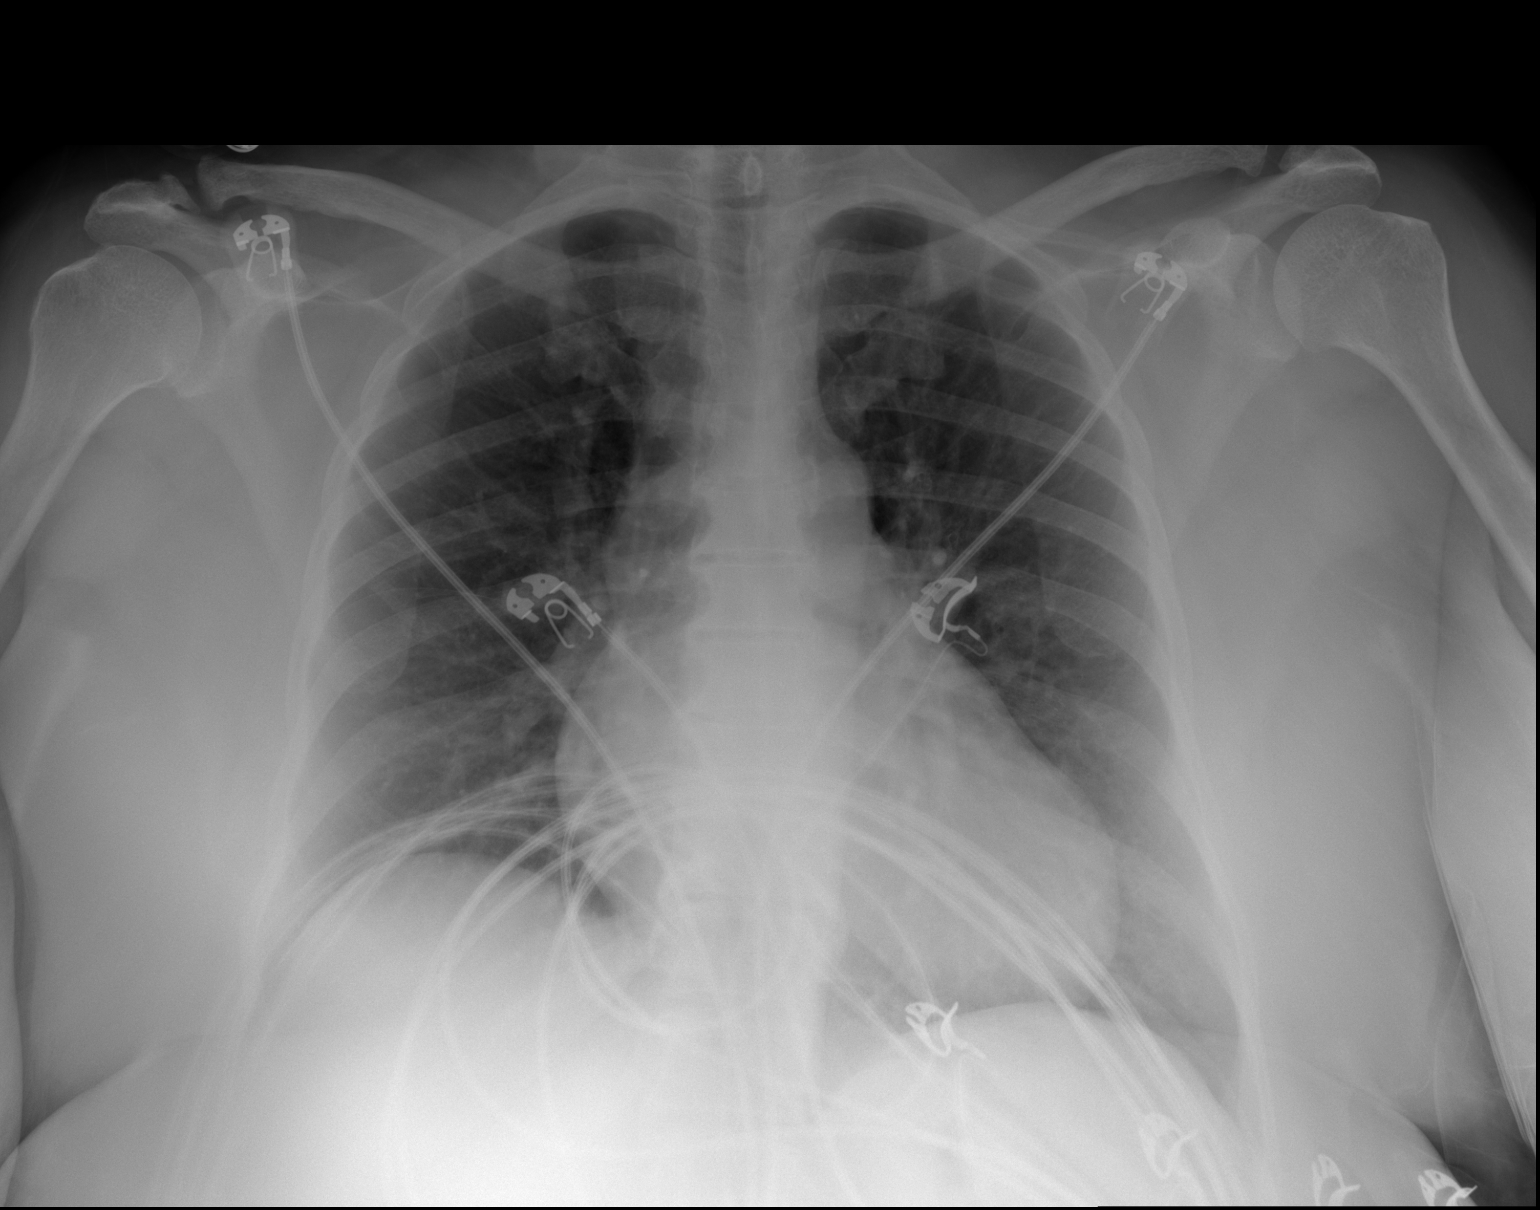

[1 of 1 positions shown; findings below may reference images not displayed]

FINDINGS: The heart size and mediastinal contours are within normal limits.
Both lungs are clear. The visualized skeletal structures are
unremarkable.
IMPRESSION: No active disease.

## 2022-05-25 ENCOUNTER — Other Ambulatory Visit (HOSPITAL_COMMUNITY): Payer: Self-pay

## 2022-06-15 ENCOUNTER — Other Ambulatory Visit: Payer: Self-pay

## 2022-06-19 ENCOUNTER — Other Ambulatory Visit (HOSPITAL_COMMUNITY): Payer: Self-pay

## 2022-06-19 ENCOUNTER — Other Ambulatory Visit: Payer: Self-pay

## 2022-06-19 ENCOUNTER — Other Ambulatory Visit: Payer: Self-pay | Admitting: Family

## 2022-06-19 MED ORDER — ALBUTEROL SULFATE HFA 108 (90 BASE) MCG/ACT IN AERS
2.0000 | INHALATION_SPRAY | RESPIRATORY_TRACT | 1 refills | Status: AC | PRN
  Filled 2022-06-19: qty 6.7, 17d supply, fill #0
  Filled 2022-06-19: qty 6.7, 16d supply, fill #0
  Filled 2022-10-30: qty 6.7, 17d supply, fill #1

## 2022-06-19 NOTE — Telephone Encounter (Signed)
Albuterol inhaler refilled for 2 puffs daily every 4 hours prn, #18 gram with 1 RF's.RX for above e-scribed and sent to pharmacy on record  [place] [address] Phone: [phone] Fax: [phone]

## 2022-08-04 ENCOUNTER — Other Ambulatory Visit: Payer: Self-pay

## 2022-08-10 ENCOUNTER — Other Ambulatory Visit: Payer: Self-pay

## 2022-08-10 DIAGNOSIS — R7303 Prediabetes: Secondary | ICD-10-CM | POA: Diagnosis not present

## 2022-08-10 MED ORDER — SEMAGLUTIDE-WEIGHT MANAGEMENT 0.25 MG/0.5ML ~~LOC~~ SOAJ
0.5000 mL | SUBCUTANEOUS | 1 refills | Status: DC
  Filled 2022-08-10: qty 2, 28d supply, fill #0

## 2022-08-11 ENCOUNTER — Other Ambulatory Visit: Payer: Self-pay

## 2022-08-14 ENCOUNTER — Other Ambulatory Visit: Payer: Self-pay

## 2022-08-22 ENCOUNTER — Other Ambulatory Visit: Payer: Self-pay

## 2022-08-28 ENCOUNTER — Other Ambulatory Visit: Payer: Self-pay

## 2022-09-05 ENCOUNTER — Other Ambulatory Visit: Payer: Self-pay

## 2022-10-13 DIAGNOSIS — M7662 Achilles tendinitis, left leg: Secondary | ICD-10-CM | POA: Diagnosis not present

## 2022-10-19 DIAGNOSIS — M7662 Achilles tendinitis, left leg: Secondary | ICD-10-CM | POA: Diagnosis not present

## 2022-11-03 ENCOUNTER — Other Ambulatory Visit: Payer: Self-pay

## 2022-11-06 ENCOUNTER — Other Ambulatory Visit: Payer: Self-pay

## 2022-11-13 DIAGNOSIS — M7662 Achilles tendinitis, left leg: Secondary | ICD-10-CM | POA: Diagnosis not present

## 2022-11-21 ENCOUNTER — Other Ambulatory Visit: Payer: Self-pay

## 2022-11-22 ENCOUNTER — Other Ambulatory Visit: Payer: Self-pay

## 2022-11-22 MED ORDER — ESOMEPRAZOLE MAGNESIUM 40 MG PO CPDR
40.0000 mg | DELAYED_RELEASE_CAPSULE | Freq: Every day | ORAL | 1 refills | Status: DC | PRN
  Filled 2022-11-22: qty 90, 90d supply, fill #0
  Filled 2023-06-04: qty 90, 90d supply, fill #1

## 2022-11-27 ENCOUNTER — Other Ambulatory Visit: Payer: Self-pay

## 2023-01-08 DIAGNOSIS — Z1231 Encounter for screening mammogram for malignant neoplasm of breast: Secondary | ICD-10-CM | POA: Diagnosis not present

## 2023-02-13 DIAGNOSIS — I10 Essential (primary) hypertension: Secondary | ICD-10-CM | POA: Diagnosis not present

## 2023-02-13 DIAGNOSIS — K219 Gastro-esophageal reflux disease without esophagitis: Secondary | ICD-10-CM | POA: Diagnosis not present

## 2023-02-13 DIAGNOSIS — E7800 Pure hypercholesterolemia, unspecified: Secondary | ICD-10-CM | POA: Diagnosis not present

## 2023-02-13 DIAGNOSIS — Z1211 Encounter for screening for malignant neoplasm of colon: Secondary | ICD-10-CM | POA: Diagnosis not present

## 2023-02-13 DIAGNOSIS — Z0000 Encounter for general adult medical examination without abnormal findings: Secondary | ICD-10-CM | POA: Diagnosis not present

## 2023-02-13 DIAGNOSIS — R7303 Prediabetes: Secondary | ICD-10-CM | POA: Diagnosis not present

## 2023-02-15 ENCOUNTER — Other Ambulatory Visit: Payer: Self-pay

## 2023-02-16 ENCOUNTER — Other Ambulatory Visit: Payer: Self-pay

## 2023-02-16 MED ORDER — METFORMIN HCL ER 500 MG PO TB24
500.0000 mg | ORAL_TABLET | Freq: Every evening | ORAL | 1 refills | Status: DC
  Filled 2023-02-16: qty 90, 90d supply, fill #0

## 2023-02-20 ENCOUNTER — Other Ambulatory Visit: Payer: Self-pay

## 2023-03-22 ENCOUNTER — Other Ambulatory Visit: Payer: Self-pay

## 2023-03-22 ENCOUNTER — Encounter: Payer: Self-pay | Admitting: Gastroenterology

## 2023-03-22 ENCOUNTER — Ambulatory Visit: Payer: Commercial Managed Care - PPO | Admitting: *Deleted

## 2023-03-22 VITALS — Ht 63.0 in | Wt 237.0 lb

## 2023-03-22 DIAGNOSIS — Z1211 Encounter for screening for malignant neoplasm of colon: Secondary | ICD-10-CM

## 2023-03-22 MED ORDER — NA SULFATE-K SULFATE-MG SULF 17.5-3.13-1.6 GM/177ML PO SOLN
1.0000 | Freq: Once | ORAL | 0 refills | Status: AC
  Filled 2023-03-22: qty 354, fill #0
  Filled 2023-03-27 (×2): qty 354, 1d supply, fill #0

## 2023-03-22 NOTE — Progress Notes (Signed)
Pt's name and DOB verified at the beginning of the pre-visit wit 2 identifiers  Pt denies any difficulty with ambulating,sitting, laying down or rolling side to side  Pt has issues with ambulation   Pt has no issues moving head neck or swallowing  No egg or soy allergy known to patient   No issues known to pt with past sedation with any surgeries or procedures  Pt denies having issues being intubated  No FH of Malignant Hyperthermia  Pt is not on diet pills or shots  Pt is not on home 02   Pt is not on blood thinners   Pt denies issues with constipation   Pt is not on dialysis  Pt denise any abnormal heart rhythms   Pt denies any upcoming cardiac testing  Pt encouraged to use to use Singlecare or Goodrx to reduce cost   Patient's chart reviewed by Cathlyn Parsons CNRA prior to pre-visit and patient appropriate for the [facility].  Pre-visit completed and red dot placed by patient's name on their procedure day (on provider's schedule).  .  Visit by phone   Pt states weight is 237 lb   Instructed pt why it is important to and  to call if they have any changes in health or new medications. Directed them to the # given and on instructions.     Instructions reviewed. Pt given both [facility] main # and MD on call # prior to instructions.  Pt states understanding. Instructed to review again prior to procedure. Pt states they will.   Instructions sent by mail with coupon and by My Chart

## 2023-03-27 ENCOUNTER — Other Ambulatory Visit: Payer: Self-pay

## 2023-03-27 ENCOUNTER — Other Ambulatory Visit (HOSPITAL_COMMUNITY): Payer: Self-pay

## 2023-04-02 DIAGNOSIS — F419 Anxiety disorder, unspecified: Secondary | ICD-10-CM | POA: Diagnosis not present

## 2023-04-02 DIAGNOSIS — F32A Depression, unspecified: Secondary | ICD-10-CM | POA: Diagnosis not present

## 2023-04-05 ENCOUNTER — Encounter: Payer: Self-pay | Admitting: Gastroenterology

## 2023-04-05 ENCOUNTER — Ambulatory Visit: Payer: Commercial Managed Care - PPO | Admitting: Gastroenterology

## 2023-04-05 VITALS — BP 116/54 | HR 60 | Temp 97.2°F | Resp 14 | Ht 63.0 in | Wt 237.0 lb

## 2023-04-05 DIAGNOSIS — Z1211 Encounter for screening for malignant neoplasm of colon: Secondary | ICD-10-CM | POA: Diagnosis not present

## 2023-04-05 DIAGNOSIS — E119 Type 2 diabetes mellitus without complications: Secondary | ICD-10-CM | POA: Diagnosis not present

## 2023-04-05 DIAGNOSIS — K640 First degree hemorrhoids: Secondary | ICD-10-CM

## 2023-04-05 DIAGNOSIS — I10 Essential (primary) hypertension: Secondary | ICD-10-CM | POA: Diagnosis not present

## 2023-04-05 DIAGNOSIS — D123 Benign neoplasm of transverse colon: Secondary | ICD-10-CM | POA: Diagnosis not present

## 2023-04-05 DIAGNOSIS — K6389 Other specified diseases of intestine: Secondary | ICD-10-CM | POA: Diagnosis not present

## 2023-04-05 DIAGNOSIS — D128 Benign neoplasm of rectum: Secondary | ICD-10-CM

## 2023-04-05 MED ORDER — SODIUM CHLORIDE 0.9 % IV SOLN: 500.0000 mL | Freq: Once | INTRAVENOUS | Status: DC

## 2023-04-05 NOTE — Patient Instructions (Signed)

## 2023-04-05 NOTE — Progress Notes (Signed)
Called to room to assist during endoscopic procedure.  Patient ID and intended procedure confirmed with present staff. Received instructions for my participation in the procedure from the performing physician.  

## 2023-04-05 NOTE — Progress Notes (Signed)
Pt's states no medical or surgical changes since previsit or office visit. 

## 2023-04-05 NOTE — Op Note (Signed)
[facility] Patient Name: Leslie [name] Procedure Date: 04/05/2023 8:39 AM MRN: [id number] Endoscopist: Lynann Bologna , MD, [id number] Age: 62 Referring MD:  Date of Birth: 04/18/1961 Gender: Female Account #: [id number] Procedure:                Colonoscopy Indications:              Screening for colorectal malignant neoplasm Medicines:                Monitored Anesthesia Care Procedure:                Pre-Anesthesia Assessment:                           - Prior to the procedure, a History and Physical                            was performed, and patient medications and                            allergies were reviewed. The patient's tolerance of                            previous anesthesia was also reviewed. The risks                            and benefits of the procedure and the sedation                            options and risks were discussed with the patient.                            All questions were answered, and informed consent                            was obtained. Prior Anticoagulants: The patient has                            taken no anticoagulant or antiplatelet agents. ASA                            Grade Assessment: II - A patient with mild systemic                            disease. After reviewing the risks and benefits,                            the patient was deemed in satisfactory condition to                            undergo the procedure.                           After obtaining informed consent, the colonoscope  was passed under direct vision. Throughout the                            procedure, the patient's blood pressure, pulse, and                            oxygen saturations were monitored continuously. The                            Olympus Scope SN [id number] was introduced through the                            anus and advanced to the 2 cm into the ileum. The                            colonoscopy  was performed without difficulty. The                            patient tolerated the procedure well. The quality                            of the bowel preparation was good. The terminal                            ileum, ileocecal valve, appendiceal orifice, and                            rectum were photographed. Scope In: 8:48:00 AM Scope Out: 8:58:47 AM Scope Withdrawal Time: 0 hours 8 minutes 13 seconds  Total Procedure Duration: 0 hours 10 minutes 47 seconds  Findings:                 Two sessile polyps were found in the distal rectum                            and mid transverse colon. The polyps were 4 to 6 mm                            in size. These polyps were removed with a cold                            snare. Resection and retrieval were complete.                           Non-bleeding internal hemorrhoids were found during                            retroflexion. The hemorrhoids were small and Grade                            I (internal hemorrhoids that do not prolapse).  The terminal ileum appeared normal.                           The exam was otherwise without abnormality on                            direct and retroflexion views. Complications:            No immediate complications. Estimated Blood Loss:     Estimated blood loss: none. Impression:               - Two 4 to 6 mm polyps in the distal rectum and in                            the mid transverse colon, removed with a cold                            snare. Resected and retrieved.                           - Non-bleeding internal hemorrhoids.                           - The examined portion of the ileum was normal.                           - The examination was otherwise normal on direct                            and retroflexion views. Recommendation:           - Patient has a contact number available for                            emergencies. The signs and symptoms of  potential                            delayed complications were discussed with the                            patient. Return to normal activities tomorrow.                            Written discharge instructions were provided to the                            patient.                           - Resume previous diet.                           - Continue present medications.                           - Await pathology results.                           -  Repeat colonoscopy for surveillance based on                            pathology results.                           - The findings and recommendations were discussed                            with the patient's family. Lynann Bologna, MD 04/05/2023 9:02:15 AM This report has been signed electronically.

## 2023-04-05 NOTE — Progress Notes (Signed)
Report to PACU, RN, vss, BBS= Clear.  

## 2023-04-06 ENCOUNTER — Telehealth: Payer: Self-pay | Admitting: *Deleted

## 2023-04-06 NOTE — Telephone Encounter (Signed)
  Follow up Call-     04/05/2023    7:24 AM  Call back number  Post procedure Call Back phone  # [phone]  Permission to leave phone message Yes     Patient questions:  Do you have a fever, pain , or abdominal swelling? No. Pain Score  0 *  Have you tolerated food without any problems? Yes.    Have you been able to return to your normal activities? Yes.    Do you have any questions about your discharge instructions: Diet   No. Medications  No. Follow up visit  No.  Do you have questions or concerns about your Care? No.  Actions: * If pain score is 4 or above: No action needed, pain <4.

## 2023-04-09 ENCOUNTER — Other Ambulatory Visit: Payer: Self-pay

## 2023-04-09 MED ORDER — FLUOXETINE HCL 10 MG PO CAPS
10.0000 mg | ORAL_CAPSULE | Freq: Every day | ORAL | 1 refills | Status: DC
  Filled 2023-04-09: qty 30, 30d supply, fill #0

## 2023-04-09 MED ORDER — METFORMIN HCL ER 500 MG PO TB24
500.0000 mg | ORAL_TABLET | Freq: Every day | ORAL | 1 refills | Status: DC
  Filled 2023-04-11: qty 90, 90d supply, fill #0

## 2023-04-10 LAB — SURGICAL PATHOLOGY

## 2023-04-11 ENCOUNTER — Other Ambulatory Visit: Payer: Self-pay

## 2023-04-15 ENCOUNTER — Encounter: Payer: Self-pay | Admitting: Gastroenterology

## 2023-05-01 ENCOUNTER — Other Ambulatory Visit: Payer: Self-pay

## 2023-05-01 DIAGNOSIS — F419 Anxiety disorder, unspecified: Secondary | ICD-10-CM | POA: Diagnosis not present

## 2023-05-01 DIAGNOSIS — F32A Depression, unspecified: Secondary | ICD-10-CM | POA: Diagnosis not present

## 2023-05-01 MED ORDER — FLUOXETINE HCL 20 MG PO CAPS
20.0000 mg | ORAL_CAPSULE | Freq: Every day | ORAL | 1 refills | Status: AC
  Filled 2023-05-01: qty 30, 30d supply, fill #0
  Filled 2023-07-20: qty 30, 30d supply, fill #1

## 2023-05-08 ENCOUNTER — Other Ambulatory Visit: Payer: Self-pay

## 2023-06-04 ENCOUNTER — Other Ambulatory Visit: Payer: Self-pay

## 2023-06-04 MED ORDER — ATENOLOL 50 MG PO TABS
50.0000 mg | ORAL_TABLET | Freq: Every day | ORAL | 1 refills | Status: DC
  Filled 2023-06-04: qty 90, 90d supply, fill #0

## 2023-06-04 MED ORDER — SPIRONOLACTONE 25 MG PO TABS
25.0000 mg | ORAL_TABLET | Freq: Every day | ORAL | 1 refills | Status: DC
  Filled 2023-06-04 – 2023-06-05 (×2): qty 90, 90d supply, fill #0

## 2023-06-04 MED ORDER — ATORVASTATIN CALCIUM 10 MG PO TABS
10.0000 mg | ORAL_TABLET | Freq: Every day | ORAL | 1 refills | Status: DC
  Filled 2023-06-04: qty 90, 90d supply, fill #0

## 2023-06-05 ENCOUNTER — Other Ambulatory Visit: Payer: Self-pay

## 2023-07-26 ENCOUNTER — Other Ambulatory Visit: Payer: Self-pay

## 2023-08-20 ENCOUNTER — Other Ambulatory Visit: Payer: Self-pay

## 2023-08-20 DIAGNOSIS — R7303 Prediabetes: Secondary | ICD-10-CM | POA: Diagnosis not present

## 2023-08-20 DIAGNOSIS — E782 Mixed hyperlipidemia: Secondary | ICD-10-CM | POA: Diagnosis not present

## 2023-08-20 DIAGNOSIS — D649 Anemia, unspecified: Secondary | ICD-10-CM | POA: Diagnosis not present

## 2023-08-20 DIAGNOSIS — K219 Gastro-esophageal reflux disease without esophagitis: Secondary | ICD-10-CM | POA: Diagnosis not present

## 2023-08-20 DIAGNOSIS — I10 Essential (primary) hypertension: Secondary | ICD-10-CM | POA: Diagnosis not present

## 2023-08-20 MED ORDER — SPIRONOLACTONE 25 MG PO TABS
25.0000 mg | ORAL_TABLET | Freq: Every day | ORAL | 2 refills | Status: DC
  Filled 2023-08-20: qty 90, 90d supply, fill #0
  Filled 2023-11-26: qty 90, 90d supply, fill #1
  Filled 2024-02-25: qty 90, 90d supply, fill #2

## 2023-08-20 MED ORDER — ATORVASTATIN CALCIUM 10 MG PO TABS
10.0000 mg | ORAL_TABLET | Freq: Every day | ORAL | 2 refills | Status: AC
Start: 2023-08-20 — End: ?
  Filled 2023-08-20: qty 90, 90d supply, fill #0
  Filled 2023-11-26: qty 90, 90d supply, fill #1
  Filled 2024-02-25: qty 90, 90d supply, fill #2

## 2023-08-20 MED ORDER — ESOMEPRAZOLE MAGNESIUM 40 MG PO CPDR
40.0000 mg | DELAYED_RELEASE_CAPSULE | Freq: Every day | ORAL | 2 refills | Status: AC | PRN
  Filled 2023-08-20: qty 90, 90d supply, fill #0

## 2023-08-20 MED ORDER — METFORMIN HCL ER 500 MG PO TB24
500.0000 mg | ORAL_TABLET | Freq: Every day | ORAL | 2 refills | Status: AC
  Filled 2023-08-20: qty 90, 90d supply, fill #0
  Filled 2023-11-22: qty 90, 90d supply, fill #1
  Filled 2024-02-18 – 2024-04-15 (×2): qty 90, 90d supply, fill #2

## 2023-08-20 MED ORDER — ATENOLOL 50 MG PO TABS
50.0000 mg | ORAL_TABLET | Freq: Every day | ORAL | 2 refills | Status: AC
Start: 2023-08-20 — End: ?
  Filled 2023-08-20: qty 90, 90d supply, fill #0
  Filled 2023-11-26: qty 90, 90d supply, fill #1
  Filled 2024-02-25: qty 90, 90d supply, fill #2

## 2023-08-23 ENCOUNTER — Emergency Department (HOSPITAL_COMMUNITY)

## 2023-08-23 ENCOUNTER — Emergency Department (HOSPITAL_COMMUNITY)
Admission: EM | Admit: 2023-08-23 | Discharge: 2023-08-23 | Disposition: A | Discharge status: Home / Self Care | Attending: Emergency Medicine | Admitting: Emergency Medicine

## 2023-08-23 ENCOUNTER — Encounter (HOSPITAL_COMMUNITY): Payer: Self-pay

## 2023-08-23 ENCOUNTER — Other Ambulatory Visit: Payer: Self-pay

## 2023-08-23 DIAGNOSIS — I10 Essential (primary) hypertension: Secondary | ICD-10-CM | POA: Insufficient documentation

## 2023-08-23 DIAGNOSIS — R531 Weakness: Secondary | ICD-10-CM | POA: Insufficient documentation

## 2023-08-23 DIAGNOSIS — I959 Hypotension, unspecified: Secondary | ICD-10-CM | POA: Diagnosis not present

## 2023-08-23 DIAGNOSIS — G8190 Hemiplegia, unspecified affecting unspecified side: Secondary | ICD-10-CM | POA: Diagnosis not present

## 2023-08-23 DIAGNOSIS — Z8673 Personal history of transient ischemic attack (TIA), and cerebral infarction without residual deficits: Secondary | ICD-10-CM | POA: Diagnosis not present

## 2023-08-23 DIAGNOSIS — R29810 Facial weakness: Secondary | ICD-10-CM | POA: Diagnosis not present

## 2023-08-23 DIAGNOSIS — R269 Unspecified abnormalities of gait and mobility: Secondary | ICD-10-CM | POA: Insufficient documentation

## 2023-08-23 DIAGNOSIS — R2990 Unspecified symptoms and signs involving the nervous system: Secondary | ICD-10-CM

## 2023-08-23 DIAGNOSIS — D649 Anemia, unspecified: Secondary | ICD-10-CM | POA: Insufficient documentation

## 2023-08-23 DIAGNOSIS — R350 Frequency of micturition: Secondary | ICD-10-CM | POA: Insufficient documentation

## 2023-08-23 DIAGNOSIS — R7309 Other abnormal glucose: Secondary | ICD-10-CM | POA: Diagnosis not present

## 2023-08-23 DIAGNOSIS — J329 Chronic sinusitis, unspecified: Secondary | ICD-10-CM | POA: Diagnosis not present

## 2023-08-23 DIAGNOSIS — Z7984 Long term (current) use of oral hypoglycemic drugs: Secondary | ICD-10-CM | POA: Insufficient documentation

## 2023-08-23 DIAGNOSIS — R0689 Other abnormalities of breathing: Secondary | ICD-10-CM | POA: Diagnosis not present

## 2023-08-23 DIAGNOSIS — R29818 Other symptoms and signs involving the nervous system: Secondary | ICD-10-CM | POA: Diagnosis not present

## 2023-08-23 LAB — URINALYSIS, ROUTINE W REFLEX MICROSCOPIC
Bilirubin Urine: NEGATIVE
Glucose, UA: NEGATIVE mg/dL
Hgb urine dipstick: NEGATIVE
Ketones, ur: NEGATIVE mg/dL
Leukocytes,Ua: NEGATIVE
Nitrite: NEGATIVE
Protein, ur: NEGATIVE mg/dL
Specific Gravity, Urine: 1.01 (ref 1.005–1.030)
pH: 6 (ref 5.0–8.0)

## 2023-08-23 LAB — COMPREHENSIVE METABOLIC PANEL WITH GFR
ALT: 17 U/L (ref 0–44)
AST: 20 U/L (ref 15–41)
Albumin: 3.3 g/dL — ABNORMAL LOW (ref 3.5–5.0)
Alkaline Phosphatase: 101 U/L (ref 38–126)
Anion gap: 11 (ref 5–15)
BUN: 16 mg/dL (ref 6–20)
CO2: 22 mmol/L (ref 22–32)
Calcium: 9.3 mg/dL (ref 8.9–10.3)
Chloride: 108 mmol/L (ref 98–111)
Creatinine, Ser: 1.08 mg/dL — ABNORMAL HIGH (ref 0.44–1.00)
GFR, Estimated: 59 mL/min — ABNORMAL LOW (ref >60)
Glucose, Bld: 221 mg/dL — ABNORMAL HIGH (ref 70–99)
Potassium: 4.2 mmol/L (ref 3.5–5.1)
Sodium: 141 mmol/L (ref 135–145)
Total Bilirubin: 0.8 mg/dL (ref 0.0–1.2)
Total Protein: 6.9 g/dL (ref 6.5–8.1)

## 2023-08-23 LAB — CBC
HCT: 32.8 % — ABNORMAL LOW (ref 36.0–46.0)
Hemoglobin: 10.3 g/dL — ABNORMAL LOW (ref 12.0–15.0)
MCH: 24.7 pg — ABNORMAL LOW (ref 26.0–34.0)
MCHC: 31.4 g/dL (ref 30.0–36.0)
MCV: 78.7 fL — ABNORMAL LOW (ref 80.0–100.0)
Platelets: 258 10*3/uL (ref 150–400)
RBC: 4.17 MIL/uL (ref 3.87–5.11)
RDW: 14.5 % (ref 11.5–15.5)
WBC: 8.4 10*3/uL (ref 4.0–10.5)
nRBC: 0 % (ref 0.0–0.2)

## 2023-08-23 LAB — RAPID URINE DRUG SCREEN, HOSP PERFORMED
Amphetamines: NOT DETECTED
Barbiturates: NOT DETECTED
Benzodiazepines: NOT DETECTED
Cocaine: NOT DETECTED
Opiates: NOT DETECTED
Tetrahydrocannabinol: POSITIVE — AB

## 2023-08-23 LAB — DIFFERENTIAL
Abs Immature Granulocytes: 0.03 10*3/uL (ref 0.00–0.07)
Basophils Absolute: 0 10*3/uL (ref 0.0–0.1)
Basophils Relative: 0 %
Eosinophils Absolute: 0.1 10*3/uL (ref 0.0–0.5)
Eosinophils Relative: 1 %
Immature Granulocytes: 0 %
Lymphocytes Relative: 25 %
Lymphs Abs: 2.1 10*3/uL (ref 0.7–4.0)
Monocytes Absolute: 0.4 10*3/uL (ref 0.1–1.0)
Monocytes Relative: 5 %
Neutro Abs: 5.7 10*3/uL (ref 1.7–7.7)
Neutrophils Relative %: 69 %

## 2023-08-23 LAB — I-STAT CHEM 8, ED
BUN: 19 mg/dL (ref 6–20)
Calcium, Ion: 1.21 mmol/L (ref 1.15–1.40)
Chloride: 108 mmol/L (ref 98–111)
Creatinine, Ser: 1.1 mg/dL — ABNORMAL HIGH (ref 0.44–1.00)
Glucose, Bld: 217 mg/dL — ABNORMAL HIGH (ref 70–99)
HCT: 33 % — ABNORMAL LOW (ref 36.0–46.0)
Hemoglobin: 11.2 g/dL — ABNORMAL LOW (ref 12.0–15.0)
Potassium: 4.2 mmol/L (ref 3.5–5.1)
Sodium: 142 mmol/L (ref 135–145)
TCO2: 23 mmol/L (ref 22–32)

## 2023-08-23 LAB — PROTIME-INR
INR: 1.2 (ref 0.8–1.2)
Prothrombin Time: 15 s (ref 11.4–15.2)

## 2023-08-23 LAB — ETHANOL: Alcohol, Ethyl (B): <15 mg/dL (ref <15)

## 2023-08-23 LAB — APTT: aPTT: 28 s (ref 24–36)

## 2023-08-23 NOTE — Discharge Instructions (Signed)
 As discussed, your labs and imaging are reassuring.  You did not have a stroke.  Please follow-up with your primary care provider in the next 1 to 3 days for reevaluation.  Get help right away if: You have sudden weakness on one side of your face or body. You have chest pain. You have trouble breathing or shortness of breath. You have problems with how you see (vision). You have trouble talking or swallowing. You have trouble standing or walking. You are light-headed or faint.

## 2023-08-23 NOTE — ED Notes (Signed)
 Pt in MRI.

## 2023-08-23 NOTE — ED Provider Notes (Signed)
 Patient care taken over at shift change.  She woke up around 12 AM, unable to get out of bed, feeling left-sided weakness with a possible left-sided facial droop. Symptoms have improved since she got here.  Had some left-sided weakness on previous providers initial evaluation but then on their reevaluation states that patient was able to get out of bed independently and had equal grip strength bilaterally.  They did not appreciate any facial droop.  She is currently at MRI at the time of shift change.  Physical Exam  BP (!) 119/53 (BP Location: Right Arm)   Pulse 61   Temp 97.6 F (36.4 C) (Oral)   Resp (!) 24   LMP 09/07/2019   SpO2 94%   Physical Exam Vitals and nursing note reviewed.  Constitutional:      General: She is not in acute distress.    Appearance: Normal appearance.  HENT:     Head: Normocephalic and atraumatic.     Mouth/Throat:     Mouth: Mucous membranes are moist.  Eyes:     Conjunctiva/sclera: Conjunctivae normal.     Pupils: Pupils are equal, round, and reactive to light.  Cardiovascular:     Rate and Rhythm: Normal rate and regular rhythm.     Pulses: Normal pulses.     Heart sounds: Normal heart sounds.  Pulmonary:     Effort: Pulmonary effort is normal.     Breath sounds: Normal breath sounds.  Abdominal:     Palpations: Abdomen is soft.     Tenderness: There is no abdominal tenderness.  Musculoskeletal:        General: Normal range of motion.     Cervical back: Normal range of motion.  Skin:    General: Skin is warm and dry.     Findings: No rash.  Neurological:     General: No focal deficit present.     Mental Status: She is alert.     Sensory: No sensory deficit.     Motor: No weakness.  Psychiatric:        Mood and Affect: Mood normal.        Behavior: Behavior normal.   Procedures  Procedures  ED Course / MDM    Medical Decision Making Amount and/or Complexity of Data Reviewed Labs: ordered. Radiology: ordered.   On reevaluation  patient reports feeling better.  No left-sided facial droop or decreased sensation - CN V & VII intact.  Equal grip strength bilaterally, sensation of upper extremities intact bilaterally.  Strength and sensation of lower extremities intact bilaterally as well.  Patient reports that she was taking MRI but was really refused because she states that tech did not identify themselves and did not ask her who she was.  She did not feel comfortable getting it done.  Discussed with patient importance of getting MRI, to suddenly rule out stroke.  She states that she is willing to get one.  Patient's nurse was notified as well as MRI staff.  Staffed my attending, Dr. Synetta Eves.  MRI is reassuring.  Discussed findings with patient.  Ambulatory referral for neurology given per patient's request.  Advise close PCP follow-up.  Patient is stable and safe for discharge home. Return precautions given.   Sonnie Dusky, PA-C 08/23/23 1608    Auston Blush, MD 08/31/23 [phone]

## 2023-08-23 NOTE — ED Notes (Signed)
 Patient transported to CT

## 2023-08-23 NOTE — ED Triage Notes (Signed)
 Patient BIB [facility] from home due to stroke like symptoms. Patient LNK is between 2100-2200. Patient woke up at 0000 and could not get out of bed. EMS reports L sided weakness, L sided facial droop, abnormal gait, and decreased grip strength in left hand. Patient states she has been urinating more than usually lately. EMS VSS. EMS gave 500cc fluids en route.

## 2023-08-23 NOTE — ED Provider Notes (Signed)
 [place] EMERGENCY DEPARTMENT AT [facility] Provider Note   CSN: [id number] Arrival date & time: 08/23/23  0308     History  Chief Complaint  Patient presents with   Stroke Symptoms    Leslie [name] is a 63 y.o. female with past medical history significant for obesity, prediabetes, hyperlipidemia who presents with concern for possible strokelike symptoms.  Last known well 20 100-20 200 last night, she woke up at midnight and reports that she could not get out of bed.  EMS reported some left-sided weakness, questionable left-sided facial droop and abnormal gait.  Decreased grip strength in the left hand, and pronator drift on the left.  Of note with increased urinary frequency.  Vital signs stable per EMS, they gave her 500 cc fluid bolus en route.  No previous history of stroke, ACS, she denies any double vision, confusion, she is speaking appropriately.  HPI     Home Medications Prior to Admission medications   Medication Sig Start Date End Date Taking? Authorizing Provider  albuterol  (VENTOLIN  HFA) 108 (90 Base) MCG/ACT inhaler Inhale 2 puffs into the lungs every 4 (four) hours as needed. 06/19/22   Paretta-Leahey, Dois Freeze, NP  atenolol  (TENORMIN ) 50 MG tablet Take 1 tablet (50 mg total) by mouth daily FOR BLOOD PRESSURE. 06/04/23     atenolol  (TENORMIN ) 50 MG tablet Take 1 tablet (50 mg total) by mouth daily FOR BLOOD PRESSURE. 08/20/23     atorvastatin  (LIPITOR) 10 MG tablet Take 1 tablet (10 mg total) by mouth daily. 06/04/23     atorvastatin  (LIPITOR) 10 MG tablet Take 1 tablet (10 mg total) by mouth daily. 08/20/23     cetirizine  (ZYRTEC ) 10 MG tablet Take 1 tablet (10 mg total) by mouth as needed for allergies. Patient taking differently: Take 10 mg by mouth daily. 07/09/19   Glenora Laos, MD  COVID-19 At Home Antigen Test [facility] COVID-19 HOME TEST) KIT Use as directed within package instructions 11/17/20   Nadene Aures [facility]  esomeprazole  (NEXIUM ) 40 MG  capsule Take 1 capsule (40 mg total) by mouth daily as needed FOR STOMACH ACID. 08/20/23     FLUoxetine  (PROZAC ) 10 MG capsule Take 1 capsule (10 mg total) by mouth daily. 04/06/23     FLUoxetine  (PROZAC ) 20 MG capsule Take 1 capsule (20 mg total) by mouth daily. 05/01/23     fluticasone  (FLONASE ) 50 MCG/ACT nasal spray Place 2 sprays into both nostrils daily. 07/09/19   Jasmine Mesi D, MD  metFORMIN  (GLUCOPHAGE -XR) 500 MG 24 hr tablet Take 1 tablet (500 mg total) by mouth with evening meal. 02/16/23     metFORMIN  (GLUCOPHAGE -XR) 500 MG 24 hr tablet Take 1 tablet (500 mg total) by mouth daily with evening meal. 04/09/23     metFORMIN  (GLUCOPHAGE -XR) 500 MG 24 hr tablet Take 1 tablet (500 mg total) by mouth daily WITH EVENING MEAL. 08/20/23     naproxen sodium (ALEVE) 220 MG tablet Take 440-660 mg by mouth daily as needed (pain). Patient not taking: Reported on 03/22/2023    [provider]  nitroGLYCERIN  (NITRO-DUR ) 0.2 mg/hr patch Apply 1/4 patch to skin once a day as needed for pain Patient not taking: Reported on 03/22/2023 04/11/21     Semaglutide -Weight Management 0.25 MG/0.5ML SOAJ Inject 0.25 mg into the skin once a week. Patient not taking: Reported on 03/22/2023 08/10/22   Jannelle Memory, MD  Soft Lens Products (REFRESH CONTACTS DROPS) SOLN Place 1 drop into both eyes 2 (two)  times daily as needed (redness).    [provider]  spironolactone  (ALDACTONE ) 25 MG tablet Take 1 tablet (25 mg total) by mouth daily. 06/04/23     spironolactone  (ALDACTONE ) 25 MG tablet Take 1 tablet (25 mg total) by mouth daily. 08/20/23     triamcinolone  ointment (KENALOG ) 0.1 % Apply topically 2 (two) times daily. 12/06/21   Paretta-Leahey, Dois Freeze, NP  Vitamin D , Ergocalciferol , (DRISDOL ) 1.25 MG (50000 UNIT) CAPS capsule Take 1 capsule (50,000 Units total) by mouth every 7 (seven) days. 07/09/19   Jasmine Mesi D, MD      Allergies    Amlodipine besylate, Chlorthalidone, Lisinopril, and  Montelukast sodium    Review of Systems   Review of Systems  All other systems reviewed and are negative.   Physical Exam Updated Vital Signs BP (!) 119/53 (BP Location: Right Arm)   Pulse 61   Temp 97.6 F (36.4 C) (Oral)   Resp (!) 24   LMP 09/07/2019   SpO2 94%  Physical Exam Vitals and nursing note reviewed.  Constitutional:      General: She is not in acute distress.    Appearance: Normal appearance.  HENT:     Head: Normocephalic and atraumatic.  Eyes:     General:        Right eye: No discharge.        Left eye: No discharge.  Cardiovascular:     Rate and Rhythm: Normal rate and regular rhythm.     Heart sounds: No murmur heard.    No friction rub. No gallop.  Pulmonary:     Effort: Pulmonary effort is normal.     Breath sounds: Normal breath sounds.  Abdominal:     General: Bowel sounds are normal.     Palpations: Abdomen is soft.  Skin:    General: Skin is warm and dry.     Capillary Refill: Capillary refill takes less than 2 seconds.  Neurological:     Mental Status: She is alert and oriented to person, place, and time.     Comments: Cranial nerves II through XII grossly intact.  Intact finger-nose-finger. Unable to perform Romberg due to unsteadiness, deferred gait.  Alert and oriented x3.  Moves all 4 limbs spontaneously, normal coordination with some prompting, overall her responses are fairly delayed..  Mild pronator drift on the left.  Intact strength 5 out of 5 right upper and lower extremities, initially with some decreased grip strength on the left, however while assessing patient's ability to stand she feels as though she is gripping both of my hands equally and quite strongly.  EMS had reported some facial droop but I do not see any on exam.   Psychiatric:        Mood and Affect: Mood normal.        Behavior: Behavior normal.     ED Results / Procedures / Treatments   Labs (all labs ordered are listed, but only abnormal results are  displayed) Labs Reviewed  CBC - Abnormal; Notable for the following components:      Result Value   Hemoglobin 10.3 (*)    HCT 32.8 (*)    MCV 78.7 (*)    MCH 24.7 (*)    All other components within normal limits  COMPREHENSIVE METABOLIC PANEL WITH GFR - Abnormal; Notable for the following components:   Glucose, Bld 221 (*)    Creatinine, Ser 1.08 (*)    Albumin 3.3 (*)    GFR, Estimated  59 (*)    All other components within normal limits  RAPID URINE DRUG SCREEN, HOSP PERFORMED - Abnormal; Notable for the following components:   Tetrahydrocannabinol POSITIVE (*)    All other components within normal limits  I-STAT CHEM 8, ED - Abnormal; Notable for the following components:   Creatinine, Ser 1.10 (*)    Glucose, Bld 217 (*)    Hemoglobin 11.2 (*)    HCT 33.0 (*)    All other components within normal limits  ETHANOL  PROTIME-INR  APTT  DIFFERENTIAL  URINALYSIS, ROUTINE W REFLEX MICROSCOPIC    EKG EKG Interpretation Date/Time:  Thursday August 23 2023 03:29:05 EDT Ventricular Rate:  61 PR Interval:  200 QRS Duration:  99 QT Interval:  426 QTC Calculation: 430 R Axis:   153  Text Interpretation: Right and left arm electrode reversal, interpretation assumes no reversal Sinus rhythm Probable lateral infarct, age indeterminate Anteroseptal infarct, old Confirmed by Townsend Freud [phone]) on 08/23/2023 5:31:33 AM  Radiology CT HEAD WO CONTRAST Result Date: 08/23/2023 CLINICAL DATA:  Acute neurologic deficit EXAM: CT HEAD WITHOUT CONTRAST TECHNIQUE: Contiguous axial images were obtained from the base of the skull through the vertex without intravenous contrast. RADIATION DOSE REDUCTION: This exam was performed according to the departmental dose-optimization program which includes automated exposure control, adjustment of the mA and/or kV according to patient size and/or use of iterative reconstruction technique. COMPARISON:  None Available. FINDINGS: Brain: No mass,hemorrhage or  extra-axial collection. Normal appearance of the parenchyma and CSF spaces. Vascular: No hyperdense vessel or unexpected vascular calcification. Skull: The visualized skull base, calvarium and extracranial soft tissues are normal. Sinuses/Orbits: Right maxillary sinus retention cyst. Normal orbits. Other: None. IMPRESSION: No acute intracranial abnormality. Electronically Signed   By: Juanetta Nordmann M.D.   On: 08/23/2023 03:53    Procedures Procedures    Medications Ordered in ED Medications - No data to display  ED Course/ Medical Decision Making/ A&P                                 Medical Decision Making Amount and/or Complexity of Data Reviewed Labs: ordered. Radiology: ordered.   This patient is a 63 y.o. female  who presents to the ED for concern of possible stroke like symptoms.   Differential diagnoses prior to evaluation: The emergent differential diagnosis includes, but is not limited to MS, CVA, spinal cord injury, ACS, arrhythmia, syncope, orthostatic hypotension, sepsis, hypoglycemia, hypoxia, electrolyte disturbance, endocrine disorder, anemia, environmental exposure, polypharmacy . This is not an exhaustive differential.   Past Medical History / Co-morbidities / Social History: Obesity, prediabetes, HTN, HLD, GERD  Physical Exam: Physical exam performed. The pertinent findings include: Mild tachypnea on arrival, respirations 24, vital signs otherwise stable.  Cranial nerves II through XII grossly intact.  Intact finger-nose-finger. Unable to perform Romberg due to unsteadiness, deferred gait.  Alert and oriented x3.  Moves all 4 limbs spontaneously, normal coordination with some prompting, overall her responses are fairly delayed..  Mild pronator drift on the left.  Intact strength 5 out of 5 right upper and lower extremities, initially with some decreased grip strength on the left, however while assessing patient's ability to stand she feels as though she is gripping  both of my hands equally and quite strongly.  EMS had reported some facial droop but I do not see any on exam  Not Van positive and outside of window for being  a tPA candidate for non-LVO stroke.  Lab Tests/Imaging studies: I personally interpreted labs/imaging and the pertinent results include: CMP with elevated glucose, 221, mildly elevated creatinine at 1.08, CBC with mild anemia, hemoglobin 10.3, no leukocytosis.  Normal PT/INR, and APTT, negative ethanol level, UA shows no evidence of infection, UDS is positive for THC, unclear if this could be contributing to patient's atypical neurologic presentation at this time..  CT head without contrast with no evidence of acute intracranial abnormality, given her unclear symptom presentation, concern for possible stroke still remaining on differential we will obtain an MRI for further evaluation. MR pending at time of handoff. I agree with the radiologist interpretation.  Cardiac monitoring: EKG obtained and interpreted by myself and attending physician which shows: Normal sinus rhythm, no acute ST-T changes   6:33 AM Care of Leala Prince transferred to [facility] and Dr.  remains at the end of my shift as the patient will require reassessment once labs/imaging have resulted. Patient presentation, ED course, and plan of care discussed with review of all pertinent labs and imaging. Please see his/her note for further details regarding further ED course and disposition. Plan at time of handoff is Reassess after MR, possibly could consider talking to neurology if any worry for TIA, although I suspect that this was secondary to possible marijuana usage, on reassessment she had no evidence of any remaining strokelike symptoms, if remaining improved with normal MRI, likely stable for outpatient follow-up. This may be altered or completely changed at the discretion of the oncoming team pending results of further workup.  Final Clinical Impression(s) / ED  Diagnoses Final diagnoses:  None    Rx / DC Orders ED Discharge Orders     None         Stefan Edge 08/23/23 1610    Lindle Rhea, MD 08/23/23 [phone]

## 2023-08-23 NOTE — ED Notes (Signed)
 Patient refused MRI. Upon patient arrival back to room patient stating she does not feel safe, patient requesting to be put in gown, then not wanting to put gown on.

## 2023-08-23 NOTE — ED Notes (Signed)
 Patient transported to MRI

## 2023-08-24 ENCOUNTER — Other Ambulatory Visit: Payer: Self-pay

## 2023-09-04 ENCOUNTER — Telehealth: Payer: Self-pay | Admitting: Medical Oncology

## 2023-09-04 ENCOUNTER — Encounter: Admitting: Internal Medicine

## 2023-09-04 ENCOUNTER — Other Ambulatory Visit

## 2023-09-04 NOTE — Telephone Encounter (Signed)
 Pt thought her appt was today , her appt is tomorrow. She is aware.

## 2023-09-05 ENCOUNTER — Telehealth: Payer: Self-pay | Admitting: Internal Medicine

## 2023-09-05 NOTE — Telephone Encounter (Signed)
 Attempted to call the patient two times and was unable to leave a voicemail. I sent the patient a message on MyChart regarding the rescheduling of her lab and new heme appointment due to Dr.Mohamed being out of the office on her originally scheduled date.

## 2023-10-01 ENCOUNTER — Encounter: Admitting: Internal Medicine

## 2023-10-01 ENCOUNTER — Other Ambulatory Visit

## 2023-10-02 ENCOUNTER — Inpatient Hospital Stay: Admitting: Internal Medicine

## 2023-10-02 ENCOUNTER — Other Ambulatory Visit: Payer: Self-pay | Admitting: Medical Oncology

## 2023-10-02 ENCOUNTER — Inpatient Hospital Stay: Attending: Internal Medicine

## 2023-10-02 VITALS — BP 144/77 | HR 57 | Temp 97.3°F | Resp 18 | Ht 63.0 in | Wt 234.9 lb

## 2023-10-02 DIAGNOSIS — D573 Sickle-cell trait: Secondary | ICD-10-CM | POA: Insufficient documentation

## 2023-10-02 DIAGNOSIS — R7303 Prediabetes: Secondary | ICD-10-CM | POA: Insufficient documentation

## 2023-10-02 DIAGNOSIS — D649 Anemia, unspecified: Secondary | ICD-10-CM | POA: Insufficient documentation

## 2023-10-02 DIAGNOSIS — D509 Iron deficiency anemia, unspecified: Secondary | ICD-10-CM | POA: Diagnosis not present

## 2023-10-02 DIAGNOSIS — I10 Essential (primary) hypertension: Secondary | ICD-10-CM | POA: Diagnosis not present

## 2023-10-02 DIAGNOSIS — K219 Gastro-esophageal reflux disease without esophagitis: Secondary | ICD-10-CM | POA: Insufficient documentation

## 2023-10-02 DIAGNOSIS — E785 Hyperlipidemia, unspecified: Secondary | ICD-10-CM | POA: Insufficient documentation

## 2023-10-02 LAB — CBC WITH DIFFERENTIAL (CANCER CENTER ONLY)
Abs Immature Granulocytes: 0.03 10*3/uL (ref 0.00–0.07)
Basophils Absolute: 0.1 10*3/uL (ref 0.0–0.1)
Basophils Relative: 1 %
Eosinophils Absolute: 0.3 10*3/uL (ref 0.0–0.5)
Eosinophils Relative: 3 %
HCT: 36.1 % (ref 36.0–46.0)
Hemoglobin: 11.7 g/dL — ABNORMAL LOW (ref 12.0–15.0)
Immature Granulocytes: 0 %
Lymphocytes Relative: 41 %
Lymphs Abs: 4 10*3/uL (ref 0.7–4.0)
MCH: 24.4 pg — ABNORMAL LOW (ref 26.0–34.0)
MCHC: 32.4 g/dL (ref 30.0–36.0)
MCV: 75.2 fL — ABNORMAL LOW (ref 80.0–100.0)
Monocytes Absolute: 0.7 10*3/uL (ref 0.1–1.0)
Monocytes Relative: 7 %
Neutro Abs: 4.7 10*3/uL (ref 1.7–7.7)
Neutrophils Relative %: 48 %
Platelet Count: 314 10*3/uL (ref 150–400)
RBC: 4.8 MIL/uL (ref 3.87–5.11)
RDW: 14.6 % (ref 11.5–15.5)
WBC Count: 9.7 10*3/uL (ref 4.0–10.5)
nRBC: 0 % (ref 0.0–0.2)

## 2023-10-02 LAB — FOLATE: Folate: 7.8 ng/mL (ref >5.9)

## 2023-10-02 LAB — IRON AND IRON BINDING CAPACITY (CC-WL,HP ONLY)
Iron: 65 ug/dL (ref 28–170)
Saturation Ratios: 18 % (ref 10.4–31.8)
TIBC: 364 ug/dL (ref 250–450)
UIBC: 299 ug/dL (ref 148–442)

## 2023-10-02 LAB — CMP (CANCER CENTER ONLY)
ALT: 19 U/L (ref 0–44)
AST: 16 U/L (ref 15–41)
Albumin: 4.4 g/dL (ref 3.5–5.0)
Alkaline Phosphatase: 128 U/L — ABNORMAL HIGH (ref 38–126)
Anion gap: 6 (ref 5–15)
BUN: 17 mg/dL (ref 6–20)
CO2: 28 mmol/L (ref 22–32)
Calcium: 9.9 mg/dL (ref 8.9–10.3)
Chloride: 105 mmol/L (ref 98–111)
Creatinine: 0.98 mg/dL (ref 0.44–1.00)
GFR, Estimated: >60 mL/min (ref >60)
Glucose, Bld: 130 mg/dL — ABNORMAL HIGH (ref 70–99)
Potassium: 4 mmol/L (ref 3.5–5.1)
Sodium: 139 mmol/L (ref 135–145)
Total Bilirubin: 0.7 mg/dL (ref 0.0–1.2)
Total Protein: 8.5 g/dL — ABNORMAL HIGH (ref 6.5–8.1)

## 2023-10-02 LAB — FERRITIN: Ferritin: 230 ng/mL (ref 11–307)

## 2023-10-02 LAB — VITAMIN B12: Vitamin B-12: 753 pg/mL (ref 180–914)

## 2023-10-02 NOTE — Progress Notes (Signed)
 [facility] [phone]   [phone]  CONSULT NOTE  REFERRING PHYSICIAN: Dr. Elida Grounds  REASON FOR CONSULTATION:  63 years old African female with anemia  HPI Leslie [name] is a 63 y.o. female.  Discussed the use of AI scribe software for clinical note transcription with the patient, who gave verbal consent to proceed.  History of Present Illness   Leslie [name] is a 62 year old female with sickle cell trait who presents with a history of anemia. She was referred by her primary care physician due to concerns about her blood count, specifically her hemoglobin levels.  She has a history of anemia associated with sickle cell trait. During a routine appointment, blood work revealed a hemoglobin level of 10.3 and a hematocrit of 32.8. Previous records from May 2021 indicated a hemoglobin level of 8.7 and a hematocrit of 27.6. She underwent a colonoscopy in December, which revealed two small polyps and internal hemorrhoids, but no other significant findings.  She experiences frequent dizzy spells, which have become more common recently. About three weeks ago, she felt dizzy while cooking and had to hold onto the refrigerator for support. She also experiences fatigue and occasional breathing issues. No chest pain, bleeding, or blood in her stool or urine.  Her current medication regimen includes over-the-counter iron supplements, which she takes sporadically. She tries to maintain a diet rich in greens and consumes red meat three to four times a week.  Her past medical history includes high blood pressure, high cholesterol, acid reflux, arthritis, and prediabetes. She has a family history of breast cancer, high blood pressure, and heart disease in her mother, and heart problems in her older sister. She is originally from [place] and works as a Research officer, political party. She is separated, has one son, and does not smoke or use drugs. She drinks alcohol  occasionally and is allergic to several medications, including amlodipine and lisinopril.      HPI  Past Medical History:  Diagnosis Date   Allergy    Anemia    Arthritis    Dyspnea    exertion   GERD (gastroesophageal reflux disease)    Heart murmur    Hyperlipidemia    Hypertension    Joint pain    Lower extremity edema    Pre-diabetes    Prediabetes    Sickle cell trait (HCC)     Past Surgical History:  Procedure Laterality Date   cyst on back     benign   cyst on neck     GANGLION CYST EXCISION     left hand   ROBOTIC ASSISTED LAPAROSCOPIC HYSTERECTOMY AND SALPINGECTOMY Bilateral 09/18/2019   Procedure: XI ROBOTIC ASSISTED LAPAROSCOPIC HYSTERECTOMY AND SALPINGECTOMY;  Surgeon: Ivery Marking, MD;  Location: [facility];  Service: Gynecology;  Laterality: Bilateral;  Tracie RNFA confirmed on 09/11/19 CS   WRIST SURGERY      Family History  Problem Relation Age of Onset   Breast cancer Mother    Hypertension Mother    Heart disease Mother    Sleep apnea Mother    Colon cancer Neg Hx    Colon polyps Neg Hx    Esophageal cancer Neg Hx    Rectal cancer Neg Hx    Stomach cancer Neg Hx     Social History Social History   Tobacco Use   Smoking status: Never   Smokeless tobacco: Never  Vaping Use   Vaping status: Never Used  Substance Use Topics   Alcohol use: Yes    Comment: occasional   Drug use: No    Allergies  Allergen Reactions   Amlodipine Besylate     Other reaction(s): edema (03/2018)   Chlorthalidone     Other reaction(s): hypokalemia (2019)   Lisinopril Other (See Comments)    Other reaction(s): Cough (10/09)   Montelukast Sodium Rash    Current Outpatient Medications  Medication Sig Dispense Refill   albuterol  (VENTOLIN  HFA) 108 (90 Base) MCG/ACT inhaler Inhale 2 puffs into the lungs every 4 (four) hours as needed. 6.7 g 1   atenolol  (TENORMIN ) 50 MG tablet Take 1 tablet (50 mg total) by mouth daily FOR BLOOD  PRESSURE. 90 tablet 2   atorvastatin  (LIPITOR) 10 MG tablet Take 1 tablet (10 mg total) by mouth daily. 90 tablet 2   cetirizine  (ZYRTEC ) 10 MG tablet Take 1 tablet (10 mg total) by mouth as needed for allergies. (Patient taking differently: Take 10 mg by mouth daily.) 30 tablet 0   esomeprazole  (NEXIUM ) 40 MG capsule Take 1 capsule (40 mg total) by mouth daily as needed FOR STOMACH ACID. (Patient taking differently: Take 40 mg by mouth daily as needed (Acid reflux, heart burn).) 90 capsule 2   FLUoxetine  (PROZAC ) 20 MG capsule Take 1 capsule (20 mg total) by mouth daily. 30 capsule 1   fluticasone  (FLONASE ) 50 MCG/ACT nasal spray Place 2 sprays into both nostrils daily. 16 g 6   metFORMIN  (GLUCOPHAGE -XR) 500 MG 24 hr tablet Take 1 tablet (500 mg total) by mouth daily WITH EVENING MEAL. 90 tablet 2   naproxen sodium (ALEVE) 220 MG tablet Take 440-660 mg by mouth daily as needed (pain). (Patient not taking: Reported on 03/22/2023)     nitroGLYCERIN  (NITRO-DUR ) 0.2 mg/hr patch Apply 1/4 patch to skin once a day as needed for pain (Patient not taking: Reported on 03/22/2023) 10 patch 0   Semaglutide -Weight Management 0.25 MG/0.5ML SOAJ Inject 0.25 mg into the skin once a week. (Patient not taking: Reported on 03/22/2023) 2 mL 1   Soft Lens Products (REFRESH CONTACTS DROPS) SOLN Place 1 drop into both eyes 2 (two) times daily as needed (redness).     spironolactone  (ALDACTONE ) 25 MG tablet Take 1 tablet (25 mg total) by mouth daily. 90 tablet 2   triamcinolone  ointment (KENALOG ) 0.1 % Apply topically 2 (two) times daily. 80 g 2   Vitamin D , Ergocalciferol , (DRISDOL ) 1.25 MG (50000 UNIT) CAPS capsule Take 1 capsule (50,000 Units total) by mouth every 7 (seven) days. 4 capsule 0   No current facility-administered medications for this visit.    Review of Systems  Constitutional: positive for fatigue Eyes: negative Ears, nose, mouth, throat, and face: negative Respiratory: positive for dyspnea on  exertion Cardiovascular: negative Gastrointestinal: negative Genitourinary:negative Integument/breast: negative Hematologic/lymphatic: negative Musculoskeletal:negative Neurological: positive for dizziness Behavioral/Psych: negative Endocrine: negative Allergic/Immunologic: negative  Physical Exam  JYN:WGNFA, healthy, no distress, well nourished, and well developed SKIN: skin color, texture, turgor are normal, no rashes or significant lesions HEAD: Normocephalic, No masses, lesions, tenderness or abnormalities EYES: normal, PERRLA, Conjunctiva are pink and non-injected EARS: External ears normal, Canals clear OROPHARYNX:no exudate, no erythema, and lips, buccal mucosa, and tongue normal  NECK: supple, no adenopathy, no JVD LYMPH:  no palpable lymphadenopathy, no hepatosplenomegaly BREAST:not examined LUNGS: clear to auscultation , and palpation HEART: regular rate & rhythm, no murmurs, and no gallops ABDOMEN:abdomen soft, non-tender, obese, normal bowel sounds, and no masses or organomegaly BACK: Back  symmetric, no curvature., No CVA tenderness EXTREMITIES:no joint deformities, effusion, or inflammation, no edema  NEURO: alert & oriented x 3 with fluent speech, no focal motor/sensory deficits  PERFORMANCE STATUS: ECOG 1  LABORATORY DATA: Lab Results  Component Value Date   WBC 9.7 10/02/2023   HGB 11.7 (L) 10/02/2023   HCT 36.1 10/02/2023   MCV 75.2 (L) 10/02/2023   PLT 314 10/02/2023      Chemistry      Component Value Date/Time   NA 142 08/23/2023 0358   NA 139 06/05/2019 0843   K 4.2 08/23/2023 0358   CL 108 08/23/2023 0358   CO2 22 08/23/2023 0320   BUN 19 08/23/2023 0358   BUN 17 06/05/2019 0843   CREATININE 1.10 (H) 08/23/2023 0358      Component Value Date/Time   CALCIUM  9.3 08/23/2023 0320   ALKPHOS 101 08/23/2023 0320   AST 20 08/23/2023 0320   ALT 17 08/23/2023 0320   BILITOT 0.8 08/23/2023 0320   BILITOT 0.5 06/05/2019 0843        RADIOGRAPHIC STUDIES: No results found.  ASSESSMENT AND PLAN: Assessment and Plan    Anemia due to sickle cell trait Chronic anemia likely due to sickle cell trait. Hemoglobin levels have been historically low, with a recent improvement to 11.7 g/dL. Low MCV is consistent with this anemia type. Symptoms include increased dizziness and fatigue. No evidence of bleeding or other causes identified. Iron deficiency and other deficiencies are being evaluated. Hemoglobin levels are expected to remain low due to sickle cell trait. - Advise taking iron supplements every other day with vitamin C or orange juice to enhance absorption. - Order studies to evaluate for iron deficiency, B12 deficiency, and folic acid  deficiency. - Schedule follow-up in six months to reassess hemoglobin levels and symptoms. - Contact her if any concerning results are found in the lab work ordered today.  Prediabetes Prediabetes noted.  Hypertension Hypertension noted.  Hyperlipidemia Hyperlipidemia noted.  Gastroesophageal reflux disease (GERD) GERD noted.  Osteoarthritis Osteoarthritis noted.  Allergy to medications Allergies to amlodipine, chlorothiazide, lisinopril, and monoclax noted.   She was advised to call immediately if she has any concerning symptoms in the interval. The patient voices understanding of current disease status and treatment options and is in agreement with the current care plan.  All questions were answered. The patient knows to call the clinic with any problems, questions or concerns. We can certainly see the patient much sooner if necessary.  Thank you so much for allowing me to participate in the care of Leslie [name]. I will continue to follow up the patient with you and assist in her care. The total time spent in the appointment was 60 minutes including review of chart and various tests results, discussions about plan of care and coordination of care plan  .   Disclaimer: This note was dictated with voice recognition software. Similar sounding words can inadvertently be transcribed and may not be corrected upon review.   Aurelio Blower October 02, 2023, 12:17 PM

## 2023-10-03 LAB — PROTEIN ELECTROPHORESIS, SERUM, WITH REFLEX
A/G Ratio: 1 (ref 0.7–1.7)
Albumin ELP: 3.9 g/dL (ref 2.9–4.4)
Alpha-1-Globulin: 0.2 g/dL (ref 0.0–0.4)
Alpha-2-Globulin: 0.8 g/dL (ref 0.4–1.0)
Beta Globulin: 1.3 g/dL (ref 0.7–1.3)
Gamma Globulin: 1.6 g/dL (ref 0.4–1.8)
Globulin, Total: 3.9 g/dL (ref 2.2–3.9)
Total Protein ELP: 7.8 g/dL (ref 6.0–8.5)

## 2023-11-13 ENCOUNTER — Ambulatory Visit: Admitting: Neurology

## 2023-11-13 ENCOUNTER — Telehealth: Payer: Self-pay | Admitting: Neurology

## 2023-11-13 ENCOUNTER — Encounter: Payer: Self-pay | Admitting: Neurology

## 2023-11-13 VITALS — BP 138/84 | HR 55 | Resp 15 | Ht 63.0 in

## 2023-11-13 DIAGNOSIS — R5383 Other fatigue: Secondary | ICD-10-CM

## 2023-11-13 DIAGNOSIS — G44229 Chronic tension-type headache, not intractable: Secondary | ICD-10-CM | POA: Diagnosis not present

## 2023-11-13 DIAGNOSIS — R42 Dizziness and giddiness: Secondary | ICD-10-CM | POA: Diagnosis not present

## 2023-11-13 DIAGNOSIS — G478 Other sleep disorders: Secondary | ICD-10-CM

## 2023-11-13 DIAGNOSIS — G459 Transient cerebral ischemic attack, unspecified: Secondary | ICD-10-CM

## 2023-11-13 DIAGNOSIS — R0683 Snoring: Secondary | ICD-10-CM

## 2023-11-13 NOTE — Patient Instructions (Addendum)
 Continue current medications Start aspirin 81 mg daily Will obtain CT angiogram head and neck Referral to sleep neurology for evaluation of sleep apnea Referral to physical therapy for management of dizziness Continue follow-up PCP Return as needed.

## 2023-11-13 NOTE — Progress Notes (Signed)
 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     HISTORY OF PRESENT ILLNESS:  Discussed the use of AI scribe software for clinical note transcription with the patient, who gave verbal consent to proceed.  This is a patient 63 year old woman with hypertension, hyperlipidemia, anxiety and sickle cell trait who is presenting with complaints of headaches and dizziness. She was referred by her primary care physician for evaluation of headaches and dizziness and a event of left sided weakness a couple months ago  She has been experiencing unexplained headaches and dizziness since late last year, leading to an emergency room visit a couple of months ago. The headaches occur upon waking up in the morning, often after waking up at night to use the restroom, typically resolving by lunchtime but sometimes returning in the evening. The headaches are sometimes accompanied by dizziness.  She experiences unexplained fatigue, feeling tired throughout the day despite getting six hours of sleep. She wakes up still feeling tired, and the fatigue has persisted since October of last year. She sometimes falls asleep while reading or watching TV, especially if the content is not engaging.  In April, she experienced an episode where she woke up unable to move her left side and began vomiting, prompting her to call 911. By the time emergency services arrived, she had regained some movement. The weakness lasted about 20 to 30 minutes. An MRI  at the hospital showed no evidence of a stroke.  She has a history of seasonal depression, for which she was prescribed Prozac . She also takes Lipitor (atorvastatin ) 10 mg daily for cholesterol management and has aspirin on hand as needed, though she has not taken it in two years.  She reports a history of sickle cell trait and increased her intake of iron supplements and African leafy greens, which normalized her blood count by the time of her hematology visit.  Episodes of dizziness are described as a spinning sensation that lasts about a minute, occurring both while walking and standing still. She has experienced dizziness at work and at home, sometimes needing to lean against a wall or sit down until it passes. The dizziness has not caused her to fall or occurred while driving. No dizziness while driving and no recent falls. She acknowledges snoring and sometimes falling asleep while reading or watching TV.      OTHER MEDICAL CONDITIONS: Sickle cell trait, Hypertension, Hyperlipidemia, Anxiety,    REVIEW OF SYSTEMS: Full 14 system review of systems performed and negative with exception of: As noted in the HPI   ALLERGIES: Allergies  Allergen Reactions   Amlodipine Besylate     Other reaction(s): edema (03/2018)   Chlorthalidone     Other reaction(s): hypokalemia (2019)   Lisinopril Other (See Comments)    Other reaction(s): Cough (10/09)   Montelukast Sodium Rash    HOME MEDICATIONS: Outpatient Medications Prior to Visit  Medication Sig Dispense Refill   albuterol  (VENTOLIN  HFA) 108 (90 Base) MCG/ACT inhaler Inhale 2 puffs into the lungs every 4 (four) hours as  needed. 6.7 g 1   atenolol  (TENORMIN ) 50 MG tablet Take 1 tablet (50 mg total) by mouth daily FOR BLOOD PRESSURE. 90 tablet 2   atorvastatin  (LIPITOR) 10 MG tablet Take 1 tablet (10 mg total) by mouth daily. 90 tablet 2   cetirizine  (ZYRTEC ) 10 MG tablet Take 1 tablet (10 mg total) by mouth as needed for allergies. (Patient  taking differently: Take 10 mg by mouth daily.) 30 tablet 0   esomeprazole  (NEXIUM ) 40 MG capsule Take 1 capsule (40 mg total) by mouth daily as needed FOR STOMACH ACID. (Patient taking differently: Take 40 mg by mouth daily as needed (Acid reflux, heart burn).) 90 capsule 2   FLUoxetine  (PROZAC ) 20 MG capsule Take 1 capsule (20 mg total) by mouth daily. 30 capsule 1   fluticasone  (FLONASE ) 50 MCG/ACT nasal spray Place 2 sprays into both nostrils daily. 16 g 6   metFORMIN  (GLUCOPHAGE -XR) 500 MG 24 hr tablet Take 1 tablet (500 mg total) by mouth daily WITH EVENING MEAL. 90 tablet 2   naproxen sodium (ALEVE) 220 MG tablet Take 440-660 mg by mouth daily as needed (pain).     nitroGLYCERIN  (NITRO-DUR ) 0.2 mg/hr patch Apply 1/4 patch to skin once a day as needed for pain 10 patch 0   Soft Lens Products (REFRESH CONTACTS DROPS) SOLN Place 1 drop into both eyes 2 (two) times daily as needed (redness).     spironolactone  (ALDACTONE ) 25 MG tablet Take 1 tablet (25 mg total) by mouth daily. 90 tablet 2   triamcinolone  ointment (KENALOG ) 0.1 % Apply topically 2 (two) times daily. 80 g 2   Vitamin D , Ergocalciferol , (DRISDOL ) 1.25 MG (50000 UNIT) CAPS capsule Take 1 capsule (50,000 Units total) by mouth every 7 (seven) days. 4 capsule 0   Semaglutide -Weight Management 0.25 MG/0.5ML SOAJ Inject 0.25 mg into the skin once a week. (Patient not taking: Reported on 03/22/2023) 2 mL 1   No facility-administered medications prior to visit.    PAST MEDICAL HISTORY: Past Medical History:  Diagnosis Date   Allergy    Anemia    Arthritis    Dyspnea    exertion   GERD (gastroesophageal reflux disease)    Heart murmur    Hyperlipidemia    Hypertension    Joint pain    Lower extremity edema    Pre-diabetes    Prediabetes    Sickle cell trait (HCC)     PAST SURGICAL HISTORY: Past Surgical History:  Procedure Laterality Date   cyst on back     benign   cyst on neck     GANGLION CYST EXCISION     left  hand   ROBOTIC ASSISTED LAPAROSCOPIC HYSTERECTOMY AND SALPINGECTOMY Bilateral 09/18/2019   Procedure: XI ROBOTIC ASSISTED LAPAROSCOPIC HYSTERECTOMY AND SALPINGECTOMY;  Surgeon: Rutherford Gain, MD;  Location: [facility];  Service: Gynecology;  Laterality: Bilateral;  Tracie RNFA confirmed on 09/11/19 CS   WRIST SURGERY      FAMILY HISTORY: Family History  Problem Relation Age of Onset   Breast cancer Mother    Hypertension Mother    Heart disease Mother    Sleep apnea Mother    Colon cancer Neg Hx    Colon polyps Neg Hx    Esophageal cancer Neg Hx    Rectal cancer Neg Hx    Stomach cancer Neg Hx     SOCIAL HISTORY: Social History   Socioeconomic History   Marital status: Legally Separated    Spouse name: Wolm  Johnson   Number of children: 1   Years of education: Not on file   Highest education level: Not on file  Occupational History   Occupation: Loss adjuster, chartered  Tobacco Use   Smoking status: Never   Smokeless tobacco: Never  Vaping Use   Vaping status: Never Used  Substance and Sexual Activity   Alcohol use: Yes    Comment: occasional   Drug use: No   Sexual activity: Not on file  Other Topics Concern   Not on file  Social History Narrative   Not on file   Social Drivers of Health   Financial Resource Strain: Not on file  Food Insecurity: Not on file  Transportation Needs: Not on file  Physical Activity: Not on file  Stress: Not on file  Social Connections: Not on file  Intimate Partner Violence: Not on file    PHYSICAL EXAM  GENERAL EXAM/CONSTITUTIONAL: Vitals:  Vitals:   11/13/23 0945  BP: 138/84  Pulse: (!) 55  Resp: 15  Height: 5' 3 (1.6 m)   Body mass index is 41.61 kg/m. Wt Readings from Last 3 Encounters:  10/02/23 234 lb 14.4 oz (106.5 kg)  04/05/23 237 lb (107.5 kg)  03/22/23 237 lb (107.5 kg)   Patient is in no distress; well developed, nourished and groomed; neck is supple  MUSCULOSKELETAL: Gait,  strength, tone, movements noted in Neurologic exam below  NEUROLOGIC: MENTAL STATUS:      No data to display         awake, alert, oriented to person, place and time recent and remote memory intact normal attention and concentration language fluent, comprehension intact, naming intact fund of knowledge appropriate  CRANIAL NERVE:  2nd, 3rd, 4th, 6th - Visual fields full to confrontation, extraocular muscles intact, no nystagmus 5th - facial sensation symmetric 7th - facial strength symmetric 8th - hearing intact 9th - palate elevates symmetrically, uvula midline 11th - shoulder shrug symmetric 12th - tongue protrusion midline  MOTOR:  normal bulk and tone, full strength in the BUE, BLE  SENSORY:  normal and symmetric to light touch  COORDINATION:  finger-nose-finger, fine finger movements normal  REFLEXES:  deep tendon reflexes present and symmetric  GAIT/STATION:  normal     DIAGNOSTIC DATA (LABS, IMAGING, TESTING) - I reviewed patient records, labs, notes, testing and imaging myself where available.  Lab Results  Component Value Date   WBC 9.7 10/02/2023   HGB 11.7 (L) 10/02/2023   HCT 36.1 10/02/2023   MCV 75.2 (L) 10/02/2023   PLT 314 10/02/2023      Component Value Date/Time   NA 139 10/02/2023 1141   NA 139 06/05/2019 0843   K 4.0 10/02/2023 1141   CL 105 10/02/2023 1141   CO2 28 10/02/2023 1141   GLUCOSE 130 (H) 10/02/2023 1141   BUN 17 10/02/2023 1141   BUN 17 06/05/2019 0843   CREATININE 0.98 10/02/2023 1141   CALCIUM  9.9 10/02/2023 1141   PROT 8.5 (H) 10/02/2023 1141   PROT 7.9 06/05/2019 0843   ALBUMIN 4.4 10/02/2023 1141   ALBUMIN 4.4 06/05/2019 0843   AST 16 10/02/2023 1141   ALT 19 10/02/2023 1141   ALKPHOS 128 (H) 10/02/2023 1141   BILITOT 0.7 10/02/2023 1141   GFRNONAA >60 10/02/2023 1141   GFRAA 51 (L) 09/19/2019 0543   Lab Results  Component Value Date   CHOL 167 06/05/2019   HDL 47 06/05/2019   LDLCALC 106 (H)  06/05/2019   TRIG 73 06/05/2019  Lab Results  Component Value Date   HGBA1C 6.0 (H) 06/05/2019   Lab Results  Component Value Date   VITAMINB12 753 10/02/2023   Lab Results  Component Value Date   TSH 1.240 02/04/2019    MRI Brain 08/23/2023 1. No evidence of an acute intracranial abnormality. 2. Mild T2 FLAIR hyperintense signal changes within the cerebral white matter, nonspecific but most often secondary to chronic small vessel ischemia. 3. Abnormal T1 hypointense marrow signal within the calvarium and within visible portions of the cervical spine. While this finding can reflect a marrow infiltrative process, the most common causes include chronic anemia, smoking and obesity. 4. Paranasal sinus disease as described.    ASSESSMENT AND PLAN  63 y.o. year old female with history of hypertension, hyperlipidemia, anxiety, sickle cell trait who is presenting with complaint of headaches, dizziness, and episode of left-sided weakness 2 months ago with a normal MRI.   Transient ischemic attack (TIA) Episode in April with left-sided weakness and vomiting, resolved within 20-30 minutes. MRI showed no stroke but evidence of small vessel disease. Symptoms consistent with TIA, defined as neurological deficit lasting less than a day with normal MRI. - Order CT angiogram of head and neck - Start daily baby aspirin for TIA prevention - Continue with Atorvastatin  daily   Dizziness Intermittent dizziness described as spinning sensation, lasting about a minute, occurring both while standing and walking. No clear triggers identified. MRI normal, suggesting non-central nervous system cause. Possible benign positional vertigo considered. - Refer to vestibular therapy for evaluation and possible Epley maneuver - Advise on use of meclizine as needed for dizziness, but not recommended for current pattern of symptoms  Sleep apnea Suspected sleep apnea due to symptoms of morning headaches, daytime  fatigue, and snoring. Symptoms are concerning with sleep apnea, including morning headaches that improve by midday and daytime fatigue despite adequate sleep duration. - Refer to sleep neurology to evaluate for sleep apnea - Advise on potential need for in-lab sleep study if home study is inconclusive  Headache Morning headaches associated with suspected sleep apnea. Headaches improve by midday and recur in the evening. Tylenol  provides relief, while NSAIDs cause stomach upset. Evaluation for sleep apnea as potential underlying cause of headaches is ongoing. - Manage headaches with Tylenol  as needed - Evaluate for sleep apnea as potential underlying cause of headaches  Seasonal depression Previously managed with Prozac . No current depressive symptoms discussed.  Sickle cell trait Sickle cell trait with history of small MCV. Hematology follow-up recommended due to previous findings of small red blood cells. No current medication required as blood count normalized with dietary changes. - Follow up with hematology as previously recommended    1. TIA (transient ischemic attack)   2. Chronic tension-type headache, not intractable   3. Dizziness   4. Snoring   5. Other fatigue   6. Non-restorative sleep      Patient Instructions  Continue current medications Start aspirin 81 mg daily Will obtain CT angiogram head and neck Referral to sleep neurology for evaluation of sleep apnea Referral to physical therapy for management of dizziness Continue follow-up PCP Return as needed.  Orders Placed This Encounter  Procedures   CT ANGIO HEAD NECK W WO CM   Ambulatory referral to Neurology   Ambulatory referral to Physical Therapy    No orders of the defined types were placed in this encounter.   Return if symptoms worsen or fail to improve.    Topanga Alvelo, MD 11/13/2023, 10:58 AM  [facility] [address] [place], [place] [address] [phone]

## 2023-11-13 NOTE — Telephone Encounter (Signed)
 no auth required sent to GI [phone]

## 2023-11-21 ENCOUNTER — Inpatient Hospital Stay
Admission: RE | Admit: 2023-11-21 | Discharge: 2023-11-21 | Disposition: A | Discharge status: Home / Self Care | Source: Ambulatory Visit | Attending: Neurology

## 2023-11-21 DIAGNOSIS — G459 Transient cerebral ischemic attack, unspecified: Secondary | ICD-10-CM | POA: Diagnosis not present

## 2023-11-21 MED ORDER — IOPAMIDOL (ISOVUE-370) INJECTION 76%
80.0000 mL | Freq: Once | INTRAVENOUS | Status: AC | PRN
Start: 2023-11-21 — End: 2023-11-21
  Administered 2023-11-21: 80 mL via INTRAVENOUS

## 2023-11-22 ENCOUNTER — Other Ambulatory Visit: Payer: Self-pay

## 2023-11-26 ENCOUNTER — Other Ambulatory Visit: Payer: Self-pay

## 2023-11-27 ENCOUNTER — Other Ambulatory Visit: Payer: Self-pay

## 2023-12-03 ENCOUNTER — Ambulatory Visit: Payer: Self-pay | Admitting: Neurology

## 2023-12-04 ENCOUNTER — Ambulatory Visit: Attending: Neurology | Admitting: Physical Therapy

## 2023-12-04 DIAGNOSIS — R42 Dizziness and giddiness: Secondary | ICD-10-CM | POA: Insufficient documentation

## 2023-12-04 DIAGNOSIS — H8112 Benign paroxysmal vertigo, left ear: Secondary | ICD-10-CM | POA: Insufficient documentation

## 2023-12-04 NOTE — Therapy (Unsigned)
 OUTPATIENT PHYSICAL THERAPY VESTIBULAR EVALUATION     Patient Name: Leslie [name] MRN: [id number] DOB:1960/06/06, 63 y.o., female Today's Date: 12/05/2023  END OF SESSION:  PT End of Session - 12/05/23 2115     Visit Number 1    Number of Visits 4    Date for PT Re-Evaluation 01/04/24    Authorization Type [employer] [facility]    PT Start Time 1450    PT Stop Time 1535    PT Time Calculation (min) 45 min    Activity Tolerance Patient tolerated treatment well    Behavior During Therapy WFL for tasks assessed/performed          Past Medical History:  Diagnosis Date   Allergy    Anemia    Arthritis    Dyspnea    exertion   GERD (gastroesophageal reflux disease)    Heart murmur    Hyperlipidemia    Hypertension    Joint pain    Lower extremity edema    Pre-diabetes    Prediabetes    Sickle cell trait (HCC)    Past Surgical History:  Procedure Laterality Date   cyst on back     benign   cyst on neck     GANGLION CYST EXCISION     left hand   ROBOTIC ASSISTED LAPAROSCOPIC HYSTERECTOMY AND SALPINGECTOMY Bilateral 09/18/2019   Procedure: XI ROBOTIC ASSISTED LAPAROSCOPIC HYSTERECTOMY AND SALPINGECTOMY;  Surgeon: Rutherford Gain, MD;  Location: [facility];  Service: Gynecology;  Laterality: Bilateral;  Tracie RNFA confirmed on 09/11/19 CS   WRIST SURGERY     Patient Active Problem List   Diagnosis Date Noted   Postmenopausal bleeding 09/18/2019   Status post total hysterectomy 09/18/2019   NAFLD (nonalcoholic fatty liver disease) 97/77/7978   Vitamin D  deficiency 06/05/2019   Other hyperlipidemia 06/05/2019   Prediabetes 05/22/2019   Depression 05/22/2019   Class 3 severe obesity with serious comorbidity and body mass index (BMI) of 40.0 to 44.9 in adult 04/10/2019    PCP: Auston Opal, DO REFERRING PROVIDER: Gregg Lek, MD  REFERRING DIAG:  Diagnosis  R42 (ICD-10-CM) - Dizziness    THERAPY DIAG:  BPPV (benign paroxysmal  positional vertigo), left  Dizziness and giddiness  ONSET DATE: March 2025  Rationale for Evaluation and Treatment: Rehabilitation  SUBJECTIVE:   SUBJECTIVE STATEMENT: Pt reports the dizziness started towards the end of 2024; states she started paying attention to it when it started happening frequently; sometimes she gets dizzy with walking down hall and also with turns  In March - was in kitchen cooking standing still - had to back up and hold onto refrigerator; Pt accompanied by: self  PERTINENT HISTORY: Per chart note This is a patient 63 year old woman with hypertension, hyperlipidemia, anxiety and sickle cell trait who is presenting with complaints of headaches and dizziness. She was referred by her primary care physician for evaluation of headaches and dizziness and a event of left sided weakness a couple months ago   She has been experiencing unexplained headaches and dizziness since late last year, leading to an emergency room visit a couple of months ago. The headaches occur upon waking up in the morning, often after waking up at night to use the restroom, typically resolving by lunchtime but sometimes returning in the evening. The headaches are sometimes accompanied by dizziness.    PAIN:  Are you having pain? No  PRECAUTIONS: None  RED FLAGS: None   WEIGHT BEARING RESTRICTIONS: No  FALLS: Has patient fallen in last 6 months? No  LIVING ENVIRONMENT: Lives with: lives alone Lives in: House/apartment  PLOF: Independent  PATIENT GOALS: resolve the dizziness  OBJECTIVE:  Note: Objective measures were completed at Evaluation unless otherwise noted.  DIAGNOSTIC FINDINGS: IMPRESSION: Non-contrast head CT:   1.  No evidence of an acute intracranial abnormality. 2. Paranasal sinus disease as described.   CTA neck:   The common carotid, internal carotid and vertebral arteries are patent within the neck without stenosis or significant atherosclerotic disease.    CTA head:   No proximal intracranial large vessel occlusion or high-grade proximal arterial stenosis identified.      COGNITION: Overall cognitive status: Within functional limits for tasks assessed  POSTURE:  No Significant postural limitations  Cervical ROM:  WNL's   GAIT: Gait pattern: WFL  VESTIBULAR ASSESSMENT:  GENERAL OBSERVATION: pt reports she had severe vertiginous episode in March 2025; dizziness can occur with walking, sitting or standing   SYMPTOM BEHAVIOR:  Subjective history: pt reports she has h/o migraines but has not had a migraine in several years; episode in March lasted longer than a few seconds; doesn't bother her with driving  Non-Vestibular symptoms: diplopia, neck pain, headaches, and nausea/vomiting  Type of dizziness: Diplopia, Imbalance (Disequilibrium), Unsteady with head/body turns, Lightheadedness/Faint, Funny feeling in the head, and World moves  Frequency: nothing for past 2 days  Duration: seconds  Aggravating factors: Induced by motion: occur when walking and when standing, Worse outside or in busy environment, Occurs when standing still , and Moving eyes  Relieving factors: rest and staying still  Progression of symptoms: unchanged  OCULOMOTOR EXAM:  Ocular Alignment: normal  Ocular ROM: No Limitations  Spontaneous Nystagmus: absent  Gaze-Induced Nystagmus: absent  Smooth Pursuits: intact  Saccades: intact   POSITIONAL TESTING: Right Dix-Hallpike: no nystagmus Left Dix-Hallpike: upbeating, left nystagmus  MOTION SENSITIVITY:  Motion Sensitivity Quotient Intensity: 0 = none, 1 = Lightheaded, 2 = Mild, 3 = Moderate, 4 = Severe, 5 = Vomiting  Intensity  1. Sitting to supine   2. Supine to L side   3. Supine to R side   4. Supine to sitting   5. L Hallpike-Dix   6. Up from L    7. R Hallpike-Dix   8. Up from R    9. Sitting, head tipped to L knee   10. Head up from L knee   11. Sitting, head tipped to R knee   12. Head  up from R knee   13. Sitting head turns x5   14.Sitting head nods x5   15. In stance, 180 turn to L                                                                                                                              TREATMENT DATE: 12-04-23   Canalith Repositioning:  Epley Left: Number of Reps: 2, Response to Treatment: symptoms improved, and Comment: no  nystagmus noted on 2nd rep     PATIENT EDUCATION: Education details: article on BPPV etiology given to pt; also picture of Epley maneuver given with pt education on correct performance of maneuver Person educated: Patient Education method: Explanation, Demonstration, and Handouts Education comprehension: verbalized understanding and returned demonstration  HOME EXERCISE PROGRAM:  to be issued as appropriate  GOALS: Goals reviewed with patient? Yes  LONG TERM GOALS: Target date: 01-04-24  Pt will have (-) Lt Dix-Hallpike test to indicate resolution of Lt BPPV. Baseline:  Goal status: INITIAL  2.  Pt will verbalize understanding of Epley maneuver and/or Brandt-Daroff exercises for self treatment prn. Baseline:  Goal status: INITIAL  3.  Pt will report no dizziness with ambulation or with standing still. Baseline:  Goal status: INITIAL   ASSESSMENT:  CLINICAL IMPRESSION: Patient is a 63 y.o. lady who was seen today for physical therapy evaluation and treatment for Lt BPPV posterior canalithiasis.  Pt had (+) Lt Dix-Hallpike test with Lt rotary upbeating nystagmus, indicative of Lt BPPV.  Epley maneuver was performed for 2 reps and symptoms were resolved on 2nd rep.  Pt will benefit from PT to continue to assess and treat Lt BPPV as needed.    OBJECTIVE IMPAIRMENTS: dizziness.   ACTIVITY LIMITATIONS: N/A  PARTICIPATION LIMITATIONS: N/A  PERSONAL FACTORS: N/A are also affecting patient's functional outcome.   REHAB POTENTIAL: Excellent  CLINICAL DECISION MAKING: Evolving/moderate complexity  EVALUATION  COMPLEXITY: Moderate   PLAN:  PT FREQUENCY: 1x/week  PT DURATION: 4 weeks  PLANNED INTERVENTIONS: 97110-Therapeutic exercises, 97530- Therapeutic activity, [id number]- Neuromuscular re-education, [phone]- Self Care, [id number]- Gait training, and [phone]- Canalith repositioning  PLAN FOR NEXT SESSION: recheck Lt BPPV   Juron Vorhees, [place], PT 12/05/2023, 9:20 PM

## 2023-12-04 NOTE — Patient Instructions (Signed)
Self Treatment for Left Posterior / Anterior Canalithiasis    Sitting on bed: 1. Turn head 45 left. (a) Lie back slowly, shoulders on pillow, head on bed. (b) Hold __20__ seconds. 2. Keeping head on bed, turn head 90 right. Hold __20_ seconds. 3. Roll to right, head on 45 angle down toward bed. Hold __20__ seconds. 4. Sit up on right side of bed. Repeat ___3_ times per session. Do _2___ sessions per day.  Copyright  [employer]. All rights reserved.   

## 2023-12-05 ENCOUNTER — Encounter: Payer: Self-pay | Admitting: Physical Therapy

## 2023-12-13 ENCOUNTER — Ambulatory Visit: Admitting: Physical Therapy

## 2023-12-20 ENCOUNTER — Other Ambulatory Visit: Payer: Self-pay

## 2023-12-20 ENCOUNTER — Institutional Professional Consult (permissible substitution): Admitting: Neurology

## 2023-12-20 ENCOUNTER — Other Ambulatory Visit (INDEPENDENT_AMBULATORY_CARE_PROVIDER_SITE_OTHER): Payer: Self-pay | Admitting: Primary Care

## 2023-12-20 MED ORDER — TRIAMCINOLONE ACETONIDE 0.1 % EX OINT
1.0000 | TOPICAL_OINTMENT | Freq: Two times a day (BID) | CUTANEOUS | 2 refills | Status: AC
  Filled 2023-12-20: qty 80, 30d supply, fill #0
  Filled 2024-04-15: qty 80, 30d supply, fill #1

## 2023-12-21 ENCOUNTER — Other Ambulatory Visit: Payer: Self-pay

## 2024-01-08 ENCOUNTER — Telehealth: Payer: Self-pay | Admitting: Neurology

## 2024-01-08 NOTE — Telephone Encounter (Signed)
 Provider out sent text as well pt needs to reschedule

## 2024-01-10 ENCOUNTER — Institutional Professional Consult (permissible substitution): Admitting: Neurology

## 2024-01-14 DIAGNOSIS — Z1231 Encounter for screening mammogram for malignant neoplasm of breast: Secondary | ICD-10-CM | POA: Diagnosis not present

## 2024-01-16 ENCOUNTER — Other Ambulatory Visit: Payer: Self-pay

## 2024-01-16 ENCOUNTER — Other Ambulatory Visit (HOSPITAL_COMMUNITY): Payer: Self-pay

## 2024-01-16 MED ORDER — ALBUTEROL SULFATE HFA 108 (90 BASE) MCG/ACT IN AERS
2.0000 | INHALATION_SPRAY | RESPIRATORY_TRACT | 3 refills | Status: AC
  Filled 2024-01-16: qty 6.7, 17d supply, fill #0
  Filled 2024-01-28 – 2024-04-15 (×2): qty 6.7, 17d supply, fill #1

## 2024-01-17 ENCOUNTER — Other Ambulatory Visit: Payer: Self-pay

## 2024-01-28 ENCOUNTER — Other Ambulatory Visit: Payer: Self-pay

## 2024-02-14 ENCOUNTER — Other Ambulatory Visit: Payer: Self-pay

## 2024-02-14 DIAGNOSIS — D573 Sickle-cell trait: Secondary | ICD-10-CM | POA: Diagnosis not present

## 2024-02-14 DIAGNOSIS — E559 Vitamin D deficiency, unspecified: Secondary | ICD-10-CM | POA: Diagnosis not present

## 2024-02-14 DIAGNOSIS — R7303 Prediabetes: Secondary | ICD-10-CM | POA: Diagnosis not present

## 2024-02-14 DIAGNOSIS — K219 Gastro-esophageal reflux disease without esophagitis: Secondary | ICD-10-CM | POA: Diagnosis not present

## 2024-02-14 DIAGNOSIS — I10 Essential (primary) hypertension: Secondary | ICD-10-CM | POA: Diagnosis not present

## 2024-02-14 DIAGNOSIS — M170 Bilateral primary osteoarthritis of knee: Secondary | ICD-10-CM | POA: Diagnosis not present

## 2024-02-14 DIAGNOSIS — D509 Iron deficiency anemia, unspecified: Secondary | ICD-10-CM | POA: Diagnosis not present

## 2024-02-14 DIAGNOSIS — Z0000 Encounter for general adult medical examination without abnormal findings: Secondary | ICD-10-CM | POA: Diagnosis not present

## 2024-02-14 DIAGNOSIS — E782 Mixed hyperlipidemia: Secondary | ICD-10-CM | POA: Diagnosis not present

## 2024-02-14 MED ORDER — METFORMIN HCL ER 500 MG PO TB24
500.0000 mg | ORAL_TABLET | Freq: Every day | ORAL | 1 refills | Status: AC
  Filled 2024-02-15: qty 90, 90d supply, fill #0
  Filled 2024-05-12: qty 90, 90d supply, fill #1

## 2024-02-14 MED ORDER — ATORVASTATIN CALCIUM 10 MG PO TABS
10.0000 mg | ORAL_TABLET | Freq: Every day | ORAL | 1 refills | Status: AC
  Filled 2024-05-19: qty 100, 100d supply, fill #0
  Filled 2024-05-19: qty 90, 90d supply, fill #0

## 2024-02-14 MED ORDER — ATENOLOL 50 MG PO TABS
50.0000 mg | ORAL_TABLET | Freq: Every day | ORAL | 1 refills | Status: AC
  Filled 2024-05-19 (×2): qty 90, 90d supply, fill #0

## 2024-02-14 MED ORDER — ESOMEPRAZOLE MAGNESIUM 40 MG PO CPDR
40.0000 mg | DELAYED_RELEASE_CAPSULE | Freq: Every day | ORAL | 1 refills | Status: AC | PRN
  Filled 2024-02-14 – 2024-05-19 (×2): qty 90, 90d supply, fill #0

## 2024-02-15 ENCOUNTER — Other Ambulatory Visit: Payer: Self-pay

## 2024-02-18 ENCOUNTER — Other Ambulatory Visit: Payer: Self-pay

## 2024-02-19 ENCOUNTER — Other Ambulatory Visit: Payer: Self-pay

## 2024-02-25 ENCOUNTER — Ambulatory Visit: Admitting: Family Medicine

## 2024-02-25 ENCOUNTER — Ambulatory Visit: Admitting: Podiatry

## 2024-02-25 ENCOUNTER — Other Ambulatory Visit: Payer: Self-pay

## 2024-02-25 VITALS — BP 138/80 | Ht 63.0 in | Wt 235.0 lb

## 2024-02-25 DIAGNOSIS — M25571 Pain in right ankle and joints of right foot: Secondary | ICD-10-CM

## 2024-02-25 MED ORDER — MELOXICAM 15 MG PO TABS
15.0000 mg | ORAL_TABLET | Freq: Every day | ORAL | 0 refills | Status: AC
  Filled 2024-02-25: qty 30, 30d supply, fill #0

## 2024-02-25 NOTE — Progress Notes (Signed)
 PCP: Auston Opal, DO  Discussed the use of AI scribe software for clinical note transcription with the patient, who gave verbal consent to proceed.  History of Present Illness Leslie [name] is a 63 year old female with arthritis who presents with right ankle pain.  Right ankle pain - Onset Saturday morning - Pain localized to the anterior right ankle - Pain worsens with pressure when stepping - No prior episodes of similar pain in this ankle - Suspects twisting the ankle during the night while massaging left knee - Tylenol  and ankle strap provided some relief, but pain recurred after walking and driving - No use of ice due to absence of swelling - No imaging performed for this issue  Past Medical History:  Diagnosis Date   Allergy    Anemia    Arthritis    Dyspnea    exertion   GERD (gastroesophageal reflux disease)    Heart murmur    Hyperlipidemia    Hypertension    Joint pain    Lower extremity edema    Pre-diabetes    Prediabetes    Sickle cell trait     Current Outpatient Medications on File Prior to Visit  Medication Sig Dispense Refill   albuterol  (VENTOLIN  HFA) 108 (90 Base) MCG/ACT inhaler Inhale 2 puffs into the lungs every 4 (four) hours as needed. 6.7 g 1   albuterol  (VENTOLIN  HFA) 108 (90 Base) MCG/ACT inhaler Inhale 2 puffs into the lungs every 4 (four) hours. 6.7 g 3   atenolol  (TENORMIN ) 50 MG tablet Take 1 tablet (50 mg total) by mouth daily FOR BLOOD PRESSURE. 90 tablet 2   atenolol  (TENORMIN ) 50 MG tablet Take 1 tablet (50 mg total) by mouth daily for blood pressure. 100 tablet 1   atorvastatin  (LIPITOR) 10 MG tablet Take 1 tablet (10 mg total) by mouth daily. 90 tablet 2   atorvastatin  (LIPITOR) 10 MG tablet Take 1 tablet (10 mg total) by mouth daily. 100 tablet 1   cetirizine  (ZYRTEC ) 10 MG tablet Take 1 tablet (10 mg total) by mouth as needed for allergies. (Patient taking differently: Take 10 mg by mouth daily.) 30 tablet 0   esomeprazole   (NEXIUM ) 40 MG capsule Take 1 capsule (40 mg total) by mouth daily as needed FOR STOMACH ACID. (Patient taking differently: Take 40 mg by mouth daily as needed (Acid reflux, heart burn).) 90 capsule 2   esomeprazole  (NEXIUM ) 40 MG capsule Take 1 capsule (40 mg total) by mouth daily as needed for stomach acid. 100 capsule 1   FLUoxetine  (PROZAC ) 20 MG capsule Take 1 capsule (20 mg total) by mouth daily. 30 capsule 1   fluticasone  (FLONASE ) 50 MCG/ACT nasal spray Place 2 sprays into both nostrils daily. 16 g 6   metFORMIN  (GLUCOPHAGE -XR) 500 MG 24 hr tablet Take 1 tablet (500 mg total) by mouth daily WITH EVENING MEAL. 90 tablet 2   metFORMIN  (GLUCOPHAGE -XR) 500 MG 24 hr tablet Take 1 tablet (500 mg total) by mouth daily with supper. 100 tablet 1   naproxen sodium (ALEVE) 220 MG tablet Take 440-660 mg by mouth daily as needed (pain).     nitroGLYCERIN  (NITRO-DUR ) 0.2 mg/hr patch Apply 1/4 patch to skin once a day as needed for pain 10 patch 0   Soft Lens Products (REFRESH CONTACTS DROPS) SOLN Place 1 drop into both eyes 2 (two) times daily as needed (redness).     spironolactone  (ALDACTONE ) 25 MG tablet Take 1 tablet (25 mg total) by mouth  daily. 90 tablet 2   triamcinolone  ointment (KENALOG ) 0.1 % Apply topically 2 (two) times daily. 80 g 2   Vitamin D , Ergocalciferol , (DRISDOL ) 1.25 MG (50000 UNIT) CAPS capsule Take 1 capsule (50,000 Units total) by mouth every 7 (seven) days. 4 capsule 0   No current facility-administered medications on file prior to visit.    Past Surgical History:  Procedure Laterality Date   cyst on back     benign   cyst on neck     GANGLION CYST EXCISION     left hand   ROBOTIC ASSISTED LAPAROSCOPIC HYSTERECTOMY AND SALPINGECTOMY Bilateral 09/18/2019   Procedure: XI ROBOTIC ASSISTED LAPAROSCOPIC HYSTERECTOMY AND SALPINGECTOMY;  Surgeon: Rutherford Gain, MD;  Location: [facility];  Service: Gynecology;  Laterality: Bilateral;  Tracie RNFA confirmed on  09/11/19 CS   WRIST SURGERY      Allergies  Allergen Reactions   Amlodipine Besylate     Other reaction(s): edema (03/2018)   Chlorthalidone     Other reaction(s): hypokalemia (2019)   Lisinopril Other (See Comments)    Other reaction(s): Cough (10/09)   Montelukast Sodium Rash    BP 138/80   Ht 5' 3 (1.6 m)   Wt 235 lb (106.6 kg)   LMP 09/07/2019   BMI 41.63 kg/m       No data to display              No data to display              Objective:  Physical Exam:  Gen: NAD, comfortable in exam room  Right ankle: No gross deformity, swelling, ecchymoses Full range of motion without pain. Mild tenderness to palpation anterior ankle joint.  No bony tenderness Negative ant drawer and negative talar tilt.   NV intact distally.   Limited MSK u/s right ankle:  No effusion.  Mild arthropathy ankle joint noted.  Talar dome appears normal. Assessment & Plan Right ankle pain Acute pain over the ankle joint, worsened by pressure. No warmth or swelling. Differential includes arthritis exacerbation, less likely gout or osteochondral defect. No fracture tenderness, tendons intact. - Perform ultrasound of the right ankle to assess for joint fluid and arthritis - noted mild arthritis. - Icing, meloxicam - Cam walker to help with ambulation for next 1-2 weeks.   - follow up in 2 weeks.

## 2024-02-25 NOTE — Patient Instructions (Signed)
 This is consistent with a flare of arthritis in the ankle. Ice 15 minutes at a time 3-4 times a day. Meloxicam 15 mg daily with food for pain and inflammation - take for 7 days then as needed.  Don't take aleve or ibuprofen  while on this. Wear the boot when up and walking around for the next week then transition off this if possible. Follow up with me in 2 weeks but call me sooner if you're struggling.

## 2024-02-26 ENCOUNTER — Other Ambulatory Visit: Payer: Self-pay

## 2024-03-10 ENCOUNTER — Ambulatory Visit: Admitting: Family Medicine

## 2024-03-10 VITALS — BP 140/70 | Ht 65.0 in | Wt 230.0 lb

## 2024-03-10 DIAGNOSIS — G8929 Other chronic pain: Secondary | ICD-10-CM

## 2024-03-10 DIAGNOSIS — M25571 Pain in right ankle and joints of right foot: Secondary | ICD-10-CM

## 2024-03-10 DIAGNOSIS — M25562 Pain in left knee: Secondary | ICD-10-CM

## 2024-03-10 NOTE — Progress Notes (Signed)
 PCP: Auston Opal, DO  Discussed the use of AI scribe software for clinical note transcription with the patient, who gave verbal consent to proceed.  History of Present Illness Leslie [name] is a 63 year old female who presents for follow-up of right ankle pain and management of left knee arthritis.  Right ankle pain - Significant improvement in symptoms, with approximately 90% reduction in pain - Discontinued use of walking boot on the third day after last visit - Stopped meloxicam after one week of use - Occasional discomfort with foot stretching, but no limitation of activities  Left knee pain and osteoarthritis symptoms - Persistent pain, predominantly on the medial aspect of the knee - Bengay and pain relief spray no longer provide effective relief - Nighttime use of a pillow between the knees offers only temporary relief - Previous cortisone injections are no longer effective - No knee x-rays performed in the past year  Past Medical History:  Diagnosis Date   Allergy    Anemia    Arthritis    Dyspnea    exertion   GERD (gastroesophageal reflux disease)    Heart murmur    Hyperlipidemia    Hypertension    Joint pain    Lower extremity edema    Pre-diabetes    Prediabetes    Sickle cell trait     Current Outpatient Medications on File Prior to Visit  Medication Sig Dispense Refill   albuterol  (VENTOLIN  HFA) 108 (90 Base) MCG/ACT inhaler Inhale 2 puffs into the lungs every 4 (four) hours as needed. 6.7 g 1   albuterol  (VENTOLIN  HFA) 108 (90 Base) MCG/ACT inhaler Inhale 2 puffs into the lungs every 4 (four) hours. 6.7 g 3   atenolol  (TENORMIN ) 50 MG tablet Take 1 tablet (50 mg total) by mouth daily FOR BLOOD PRESSURE. 90 tablet 2   atenolol  (TENORMIN ) 50 MG tablet Take 1 tablet (50 mg total) by mouth daily for blood pressure. 100 tablet 1   atorvastatin  (LIPITOR) 10 MG tablet Take 1 tablet (10 mg total) by mouth daily. 90 tablet 2   atorvastatin  (LIPITOR) 10 MG  tablet Take 1 tablet (10 mg total) by mouth daily. 100 tablet 1   cetirizine  (ZYRTEC ) 10 MG tablet Take 1 tablet (10 mg total) by mouth as needed for allergies. (Patient taking differently: Take 10 mg by mouth daily.) 30 tablet 0   esomeprazole  (NEXIUM ) 40 MG capsule Take 1 capsule (40 mg total) by mouth daily as needed FOR STOMACH ACID. (Patient taking differently: Take 40 mg by mouth daily as needed (Acid reflux, heart burn).) 90 capsule 2   esomeprazole  (NEXIUM ) 40 MG capsule Take 1 capsule (40 mg total) by mouth daily as needed for stomach acid. 100 capsule 1   FLUoxetine  (PROZAC ) 20 MG capsule Take 1 capsule (20 mg total) by mouth daily. 30 capsule 1   fluticasone  (FLONASE ) 50 MCG/ACT nasal spray Place 2 sprays into both nostrils daily. 16 g 6   meloxicam (MOBIC) 15 MG tablet Take 1 tablet (15 mg total) by mouth daily. 30 tablet 0   metFORMIN  (GLUCOPHAGE -XR) 500 MG 24 hr tablet Take 1 tablet (500 mg total) by mouth daily WITH EVENING MEAL. 90 tablet 2   metFORMIN  (GLUCOPHAGE -XR) 500 MG 24 hr tablet Take 1 tablet (500 mg total) by mouth daily with supper. 100 tablet 1   naproxen sodium (ALEVE) 220 MG tablet Take 440-660 mg by mouth daily as needed (pain).     nitroGLYCERIN  (NITRO-DUR ) 0.2 mg/hr patch  Apply 1/4 patch to skin once a day as needed for pain 10 patch 0   Soft Lens Products (REFRESH CONTACTS DROPS) SOLN Place 1 drop into both eyes 2 (two) times daily as needed (redness).     spironolactone  (ALDACTONE ) 25 MG tablet Take 1 tablet (25 mg total) by mouth daily. 90 tablet 2   triamcinolone  ointment (KENALOG ) 0.1 % Apply topically 2 (two) times daily. 80 g 2   Vitamin D , Ergocalciferol , (DRISDOL ) 1.25 MG (50000 UNIT) CAPS capsule Take 1 capsule (50,000 Units total) by mouth every 7 (seven) days. 4 capsule 0   No current facility-administered medications on file prior to visit.    Past Surgical History:  Procedure Laterality Date   cyst on back     benign   cyst on neck     GANGLION  CYST EXCISION     left hand   ROBOTIC ASSISTED LAPAROSCOPIC HYSTERECTOMY AND SALPINGECTOMY Bilateral 09/18/2019   Procedure: XI ROBOTIC ASSISTED LAPAROSCOPIC HYSTERECTOMY AND SALPINGECTOMY;  Surgeon: Rutherford Gain, MD;  Location: [facility];  Service: Gynecology;  Laterality: Bilateral;  Tracie RNFA confirmed on 09/11/19 CS   WRIST SURGERY      Allergies  Allergen Reactions   Amlodipine Besylate     Other reaction(s): edema (03/2018)   Chlorthalidone     Other reaction(s): hypokalemia (2019)   Lisinopril Other (See Comments)    Other reaction(s): Cough (10/09)   Montelukast Sodium Rash    BP (!) 140/70   Ht 5' 5 (1.651 m)   Wt 230 lb (104.3 kg)   LMP 09/07/2019   BMI 38.27 kg/m       No data to display              No data to display              Objective:  Physical Exam:  Gen: NAD, comfortable in exam room  Right ankle: No gross deformity, swelling, ecchymoses Full range of motion No tenderness to palpation  Negative ant drawer and negative talar tilt.   NV intact distally.   Assessment & Plan Right ankle pain Significantly improved, 90% better. No longer requires meloxicam or boot. Occasional discomfort with stretching, no activity restrictions. Full motion and strength achieved. - Discontinued meloxicam unless needed for pain. - Use ice as needed for pain management. - No activity restrictions. - Contact provider if any problems arise.  Left knee osteoarthritis Chronic osteoarthritis with worsening symptoms. Previous treatments less effective. Pain localized medially. Discussed potential for worsening arthritis and limited efficacy of conservative treatments. Consideration of more aggressive interventions if x-rays indicate severe arthritis. - Ordered standing x-rays of the left knee to assess severity of arthritis. - Will discuss potential for gel shots or PRP. - Consider physical therapy to strengthen quadriceps and reduce  knee pressure. - Discuss use of supplements like Boswellia or curcumin for anti-inflammatory effects.

## 2024-04-15 ENCOUNTER — Other Ambulatory Visit: Payer: Self-pay

## 2024-04-16 ENCOUNTER — Inpatient Hospital Stay

## 2024-04-16 ENCOUNTER — Inpatient Hospital Stay: Admitting: Internal Medicine

## 2024-05-12 ENCOUNTER — Other Ambulatory Visit (HOSPITAL_COMMUNITY): Payer: Self-pay

## 2024-05-16 ENCOUNTER — Other Ambulatory Visit: Payer: Self-pay

## 2024-05-19 ENCOUNTER — Other Ambulatory Visit: Payer: Self-pay

## 2024-05-19 ENCOUNTER — Other Ambulatory Visit (HOSPITAL_COMMUNITY): Payer: Self-pay

## 2024-05-20 ENCOUNTER — Other Ambulatory Visit: Payer: Self-pay
